# Patient Record
Sex: Female | Born: 1990 | Race: Black or African American | Hispanic: No | Marital: Single | Smoking: Current every day smoker
Health system: Southern US, Community
[De-identification: ages and names within clinical notes are randomized; demographics above are authoritative.]

## PROBLEM LIST (undated history)

## (undated) ENCOUNTER — Inpatient Hospital Stay: Payer: Self-pay

## (undated) DIAGNOSIS — K819 Cholecystitis, unspecified: Secondary | ICD-10-CM

## (undated) DIAGNOSIS — F1910 Other psychoactive substance abuse, uncomplicated: Secondary | ICD-10-CM

## (undated) DIAGNOSIS — Z8719 Personal history of other diseases of the digestive system: Secondary | ICD-10-CM

## (undated) HISTORY — DX: Cholecystitis, unspecified: K81.9

---
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
## 2013-09-27 ENCOUNTER — Emergency Department: Payer: Self-pay | Admitting: Emergency Medicine

## 2013-09-27 LAB — URINALYSIS, COMPLETE
BILIRUBIN, UR: NEGATIVE
Blood: NEGATIVE
GLUCOSE, UR: NEGATIVE mg/dL (ref 0–75)
Ketone: NEGATIVE
NITRITE: NEGATIVE
PH: 7 (ref 4.5–8.0)
Protein: 30
Specific Gravity: 1.026 (ref 1.003–1.030)
Squamous Epithelial: 1

## 2014-03-03 ENCOUNTER — Emergency Department: Payer: Self-pay | Admitting: Emergency Medicine

## 2014-03-03 LAB — CBC
HCT: 40.5 % (ref 35.0–47.0)
HGB: 13 g/dL (ref 12.0–16.0)
MCH: 28.4 pg (ref 26.0–34.0)
MCHC: 32.2 g/dL (ref 32.0–36.0)
MCV: 88 fL (ref 80–100)
PLATELETS: 314 10*3/uL (ref 150–440)
RBC: 4.6 10*6/uL (ref 3.80–5.20)
RDW: 13.9 % (ref 11.5–14.5)
WBC: 8.9 10*3/uL (ref 3.6–11.0)

## 2014-03-03 LAB — BASIC METABOLIC PANEL
Anion Gap: 6 — ABNORMAL LOW (ref 7–16)
BUN: 7 mg/dL (ref 7–18)
Calcium, Total: 8.8 mg/dL (ref 8.5–10.1)
Chloride: 108 mmol/L — ABNORMAL HIGH (ref 98–107)
Co2: 27 mmol/L (ref 21–32)
Creatinine: 0.99 mg/dL (ref 0.60–1.30)
EGFR (African American): >60
GLUCOSE: 80 mg/dL (ref 65–99)
Osmolality: 278 (ref 275–301)
Potassium: 3.6 mmol/L (ref 3.5–5.1)
Sodium: 141 mmol/L (ref 136–145)

## 2014-03-03 LAB — URINALYSIS, COMPLETE
BILIRUBIN, UR: NEGATIVE
Bacteria: NONE SEEN
Blood: NEGATIVE
GLUCOSE, UR: NEGATIVE mg/dL (ref 0–75)
Nitrite: NEGATIVE
PROTEIN: NEGATIVE
Ph: 6 (ref 4.5–8.0)
RBC,UR: 3 /HPF (ref 0–5)
Specific Gravity: 1.027 (ref 1.003–1.030)
WBC UR: 8 /HPF (ref 0–5)

## 2014-05-27 ENCOUNTER — Emergency Department: Payer: Self-pay | Admitting: Emergency Medicine

## 2014-09-04 ENCOUNTER — Emergency Department: Payer: Self-pay | Admitting: Internal Medicine

## 2014-09-04 LAB — COMPREHENSIVE METABOLIC PANEL
ALBUMIN: 3.9 g/dL (ref 3.4–5.0)
ALK PHOS: 60 U/L
Anion Gap: 8 (ref 7–16)
BUN: 10 mg/dL (ref 7–18)
Bilirubin,Total: 0.4 mg/dL (ref 0.2–1.0)
CHLORIDE: 110 mmol/L — AB (ref 98–107)
Calcium, Total: 8.7 mg/dL (ref 8.5–10.1)
Co2: 24 mmol/L (ref 21–32)
Creatinine: 0.89 mg/dL (ref 0.60–1.30)
EGFR (African American): >60
EGFR (Non-African Amer.): >60
Glucose: 88 mg/dL (ref 65–99)
OSMOLALITY: 282 (ref 275–301)
POTASSIUM: 3.5 mmol/L (ref 3.5–5.1)
SGOT(AST): 25 U/L (ref 15–37)
SGPT (ALT): 20 U/L
Sodium: 142 mmol/L (ref 136–145)
TOTAL PROTEIN: 7.6 g/dL (ref 6.4–8.2)

## 2014-09-04 LAB — CBC WITH DIFFERENTIAL/PLATELET
Basophil #: 0 10*3/uL (ref 0.0–0.1)
Basophil %: 0.3 %
Eosinophil #: 0.1 10*3/uL (ref 0.0–0.7)
Eosinophil %: 0.8 %
HCT: 42.8 % (ref 35.0–47.0)
HGB: 13.4 g/dL (ref 12.0–16.0)
LYMPHS ABS: 2.1 10*3/uL (ref 1.0–3.6)
Lymphocyte %: 22.7 %
MCH: 28.5 pg (ref 26.0–34.0)
MCHC: 31.3 g/dL — ABNORMAL LOW (ref 32.0–36.0)
MCV: 91 fL (ref 80–100)
Monocyte #: 0.5 x10 3/mm (ref 0.2–0.9)
Monocyte %: 5.6 %
NEUTROS PCT: 70.6 %
Neutrophil #: 6.5 10*3/uL (ref 1.4–6.5)
PLATELETS: 299 10*3/uL (ref 150–440)
RBC: 4.7 10*6/uL (ref 3.80–5.20)
RDW: 13 % (ref 11.5–14.5)
WBC: 9.2 10*3/uL (ref 3.6–11.0)

## 2014-09-04 LAB — TROPONIN I

## 2014-09-09 DIAGNOSIS — Z8719 Personal history of other diseases of the digestive system: Secondary | ICD-10-CM

## 2014-09-09 HISTORY — DX: Personal history of other diseases of the digestive system: Z87.19

## 2014-10-14 ENCOUNTER — Emergency Department: Payer: Self-pay | Admitting: Emergency Medicine

## 2014-10-14 LAB — URINALYSIS, COMPLETE
BILIRUBIN, UR: NEGATIVE
Bacteria: NONE SEEN
Blood: NEGATIVE
GLUCOSE, UR: NEGATIVE mg/dL (ref 0–75)
KETONE: NEGATIVE
Leukocyte Esterase: NEGATIVE
Nitrite: NEGATIVE
Ph: 6 (ref 4.5–8.0)
Protein: NEGATIVE
SPECIFIC GRAVITY: 1.024 (ref 1.003–1.030)

## 2014-10-14 LAB — HCG, QUANTITATIVE, PREGNANCY: Beta Hcg, Quant.: 25704 m[IU]/mL — ABNORMAL HIGH

## 2014-10-18 ENCOUNTER — Ambulatory Visit: Payer: Self-pay | Admitting: Family Medicine

## 2015-05-18 ENCOUNTER — Emergency Department: Payer: Medicaid Other

## 2015-05-18 ENCOUNTER — Observation Stay
Admission: EM | Admit: 2015-05-18 | Discharge: 2015-05-20 | Disposition: A | Discharge status: Home / Self Care | Payer: Medicaid Other | Attending: Surgery | Admitting: Surgery

## 2015-05-18 ENCOUNTER — Encounter: Payer: Self-pay | Admitting: Emergency Medicine

## 2015-05-18 DIAGNOSIS — K8012 Calculus of gallbladder with acute and chronic cholecystitis without obstruction: Secondary | ICD-10-CM | POA: Diagnosis not present

## 2015-05-18 DIAGNOSIS — K810 Acute cholecystitis: Secondary | ICD-10-CM | POA: Insufficient documentation

## 2015-05-18 DIAGNOSIS — R112 Nausea with vomiting, unspecified: Secondary | ICD-10-CM | POA: Insufficient documentation

## 2015-05-18 DIAGNOSIS — K819 Cholecystitis, unspecified: Secondary | ICD-10-CM | POA: Diagnosis present

## 2015-05-18 LAB — CBC WITH DIFFERENTIAL/PLATELET
BASOS PCT: 0 %
Basophils Absolute: 0 10*3/uL (ref 0–0.1)
EOS ABS: 0.2 10*3/uL (ref 0–0.7)
EOS PCT: 2 %
HEMATOCRIT: 42.3 % (ref 35.0–47.0)
Hemoglobin: 14 g/dL (ref 12.0–16.0)
Lymphocytes Relative: 23 %
Lymphs Abs: 2.6 10*3/uL (ref 1.0–3.6)
MCH: 29 pg (ref 26.0–34.0)
MCHC: 33.1 g/dL (ref 32.0–36.0)
MCV: 87.4 fL (ref 80.0–100.0)
MONO ABS: 0.8 10*3/uL (ref 0.2–0.9)
MONOS PCT: 7 %
Neutro Abs: 7.6 10*3/uL — ABNORMAL HIGH (ref 1.4–6.5)
Neutrophils Relative %: 68 %
Platelets: 312 10*3/uL (ref 150–440)
RBC: 4.84 MIL/uL (ref 3.80–5.20)
RDW: 12.7 % (ref 11.5–14.5)
WBC: 11.2 10*3/uL — ABNORMAL HIGH (ref 3.6–11.0)

## 2015-05-18 LAB — COMPREHENSIVE METABOLIC PANEL
ALBUMIN: 4 g/dL (ref 3.5–5.0)
ALK PHOS: 66 U/L (ref 38–126)
ALT: 11 U/L — AB (ref 14–54)
AST: 21 U/L (ref 15–41)
Anion gap: 6 (ref 5–15)
BUN: 9 mg/dL (ref 6–20)
CALCIUM: 9.4 mg/dL (ref 8.9–10.3)
CO2: 28 mmol/L (ref 22–32)
CREATININE: 0.84 mg/dL (ref 0.44–1.00)
Chloride: 106 mmol/L (ref 101–111)
GFR calc Af Amer: >60 mL/min (ref >60)
GFR calc non Af Amer: >60 mL/min (ref >60)
GLUCOSE: 104 mg/dL — AB (ref 65–99)
Potassium: 3.5 mmol/L (ref 3.5–5.1)
SODIUM: 140 mmol/L (ref 135–145)
Total Bilirubin: 0.3 mg/dL (ref 0.3–1.2)
Total Protein: 7.2 g/dL (ref 6.5–8.1)

## 2015-05-18 LAB — LIPASE, BLOOD: LIPASE: 21 U/L — AB (ref 22–51)

## 2015-05-18 MED ORDER — ONDANSETRON HCL 4 MG/2ML IJ SOLN
4.0000 mg | Freq: Four times a day (QID) | INTRAMUSCULAR | Status: DC | PRN
  Administered 2015-05-19: 4 mg via INTRAVENOUS
  Filled 2015-05-18: qty 2

## 2015-05-18 MED ORDER — ONDANSETRON HCL 4 MG/2ML IJ SOLN
4.0000 mg | Freq: Once | INTRAMUSCULAR | Status: AC
  Administered 2015-05-18: 4 mg via INTRAVENOUS

## 2015-05-18 MED ORDER — OXYCODONE-ACETAMINOPHEN 5-325 MG PO TABS
1.0000 | ORAL_TABLET | Freq: Four times a day (QID) | ORAL | Status: DC | PRN
  Administered 2015-05-19 – 2015-05-20 (×2): 2 via ORAL
  Filled 2015-05-18 (×2): qty 2

## 2015-05-18 MED ORDER — FAMOTIDINE IN NACL 20-0.9 MG/50ML-% IV SOLN
20.0000 mg | Freq: Once | INTRAVENOUS | Status: AC
  Administered 2015-05-18: 20 mg via INTRAVENOUS
  Filled 2015-05-18: qty 50

## 2015-05-18 MED ORDER — SODIUM CHLORIDE 0.9 % IV SOLN: 8.0000 mg | Freq: Once | INTRAVENOUS | Status: DC

## 2015-05-18 MED ORDER — PROMETHAZINE HCL 25 MG PO TABS
12.5000 mg | ORAL_TABLET | Freq: Four times a day (QID) | ORAL | Status: DC | PRN
  Administered 2015-05-18: 12.5 mg via ORAL
  Filled 2015-05-18 (×2): qty 1

## 2015-05-18 MED ORDER — HEPARIN SODIUM (PORCINE) 5000 UNIT/ML IJ SOLN
5000.0000 [IU] | Freq: Three times a day (TID) | INTRAMUSCULAR | Status: DC
  Administered 2015-05-18 – 2015-05-20 (×6): 5000 [IU] via SUBCUTANEOUS
  Filled 2015-05-18 (×6): qty 1

## 2015-05-18 MED ORDER — ACETAMINOPHEN 500 MG PO TABS
1000.0000 mg | ORAL_TABLET | Freq: Once | ORAL | Status: AC
  Administered 2015-05-18: 1000 mg via ORAL
  Filled 2015-05-18: qty 2

## 2015-05-18 MED ORDER — ONDANSETRON HCL 4 MG/2ML IJ SOLN
INTRAMUSCULAR | Status: AC
  Filled 2015-05-18: qty 4

## 2015-05-18 MED ORDER — ACETAMINOPHEN 325 MG PO TABS
650.0000 mg | ORAL_TABLET | Freq: Four times a day (QID) | ORAL | Status: DC | PRN
Start: 2015-05-18 — End: 2015-05-20

## 2015-05-18 MED ORDER — ACETAMINOPHEN 650 MG RE SUPP
650.0000 mg | Freq: Four times a day (QID) | RECTAL | Status: DC | PRN
Start: 2015-05-18 — End: 2015-05-20

## 2015-05-18 MED ORDER — HYDROMORPHONE HCL 1 MG/ML IJ SOLN
1.0000 mg | INTRAMUSCULAR | Status: DC | PRN
  Administered 2015-05-18 – 2015-05-20 (×7): 1 mg via INTRAVENOUS
  Filled 2015-05-18 (×8): qty 1

## 2015-05-18 MED ORDER — ONDANSETRON HCL 4 MG/2ML IJ SOLN
4.0000 mg | Freq: Once | INTRAMUSCULAR | Status: AC
  Administered 2015-05-18: 4 mg via INTRAVENOUS
  Filled 2015-05-18: qty 2

## 2015-05-18 MED ORDER — SODIUM CHLORIDE 0.9 % IV SOLN
3.0000 g | Freq: Four times a day (QID) | INTRAVENOUS | Status: DC
  Administered 2015-05-18 – 2015-05-19 (×5): 3 g via INTRAVENOUS
  Filled 2015-05-18 (×8): qty 3

## 2015-05-18 MED ORDER — DEXTROSE IN LACTATED RINGERS 5 % IV SOLN
INTRAVENOUS | Status: DC
  Administered 2015-05-18 – 2015-05-20 (×4): via INTRAVENOUS

## 2015-05-18 MED ORDER — SODIUM CHLORIDE 0.9 % IV BOLUS (SEPSIS)
1000.0000 mL | Freq: Once | INTRAVENOUS | Status: AC
  Administered 2015-05-18: 1000 mL via INTRAVENOUS

## 2015-05-18 NOTE — ED Notes (Signed)
Pt to u/s and xray at this time

## 2015-05-18 NOTE — H&P (Signed)
Tracy Glover is an 24 y.o. female.     Chief Complaint:     One (1) week of not feeling well, abdominal pain   HPI:    24 year old female without past medical history presents to the emergency room with approximately one-week history of nearly persistent right upper quadrant abdominal pain radiating to her back along with nausea vomiting and inability to take by mouth. Workup in the emergency room demonstrates gallstone lodged within the gallbladder neck. She's had 2 children ages 12 and 65 both by cesarean section without complication. The patient does not smoke and occasionally drinks. Does not have any current medications or allergies to medications.  Past medical history negative.  Past surgical history cesarean section 2.  Family history is negative for cholelithiasis.  Social History:  reports that she has never smoked. She has never used smokeless tobacco. She reports that she does not drink alcohol. Her drug history is not on file.  Allergies: No Known Allergies   Review of Systems  Constitutional: Positive for malaise/fatigue and diaphoresis. Negative for fever, chills and weight loss.  HENT: Negative.   Eyes: Negative.   Cardiovascular: Negative for chest pain and palpitations.  Gastrointestinal: Positive for nausea, vomiting and abdominal pain. Negative for heartburn, diarrhea, constipation, blood in stool and melena.  Genitourinary: Negative for dysuria, urgency and frequency.  Skin: Negative.   Neurological: Positive for weakness.    Physical Exam:  Physical Exam  Constitutional: She is oriented to person, place, and time and well-developed, well-nourished, and in no distress. No distress.  HENT:  Head: Normocephalic and atraumatic.  Eyes: Conjunctivae are normal. Pupils are equal, round, and reactive to light.  Neck: Normal range of motion.  Cardiovascular: Normal rate and regular rhythm.   Pulmonary/Chest: No respiratory distress.  Abdominal: Soft.  Normal appearance. She exhibits no mass. There is tenderness in the right lower quadrant and epigastric area. There is rebound, guarding and positive Murphy's sign. There is no CVA tenderness.    Neurological: She is oriented to person, place, and time.  Skin: Skin is warm and dry. She is not diaphoretic.  Psychiatric: Mood, memory, affect and judgment normal.    Blood pressure 117/75, pulse 68, temperature 98.5 F (36.9 C), temperature source Oral, resp. rate 18, height 5' 6"  (1.676 m), weight 170 lb (77.111 kg), last menstrual period 04/17/2015, SpO2 100 %.  Results for orders placed or performed during the hospital encounter of 05/18/15 (from the past 48 hour(s))  Comprehensive metabolic panel     Status: Abnormal   Collection Time: 05/18/15  4:00 AM  Result Value Ref Range   Sodium 140 135 - 145 mmol/L   Potassium 3.5 3.5 - 5.1 mmol/L   Chloride 106 101 - 111 mmol/L   CO2 28 22 - 32 mmol/L   Glucose, Bld 104 (H) 65 - 99 mg/dL   BUN 9 6 - 20 mg/dL   Creatinine, Ser 0.84 0.44 - 1.00 mg/dL   Calcium 9.4 8.9 - 10.3 mg/dL   Total Protein 7.2 6.5 - 8.1 g/dL   Albumin 4.0 3.5 - 5.0 g/dL   AST 21 15 - 41 U/L   ALT 11 (L) 14 - 54 U/L   Alkaline Phosphatase 66 38 - 126 U/L   Total Bilirubin 0.3 0.3 - 1.2 mg/dL   GFR calc non Af Amer >60 >60 mL/min   GFR calc Af Amer >60 >60 mL/min    Comment: (NOTE) The eGFR has been calculated using the  CKD EPI equation. This calculation has not been validated in all clinical situations. eGFR's persistently <60 mL/min signify possible Chronic Kidney Disease.    Anion gap 6 5 - 15  CBC WITH DIFFERENTIAL     Status: Abnormal   Collection Time: 05/18/15  4:00 AM  Result Value Ref Range   WBC 11.2 (H) 3.6 - 11.0 K/uL   RBC 4.84 3.80 - 5.20 MIL/uL   Hemoglobin 14.0 12.0 - 16.0 g/dL   HCT 42.3 35.0 - 47.0 %   MCV 87.4 80.0 - 100.0 fL   MCH 29.0 26.0 - 34.0 pg   MCHC 33.1 32.0 - 36.0 g/dL   RDW 12.7 11.5 - 14.5 %   Platelets 312 150 - 440 K/uL    Neutrophils Relative % 68 %   Neutro Abs 7.6 (H) 1.4 - 6.5 K/uL   Lymphocytes Relative 23 %   Lymphs Abs 2.6 1.0 - 3.6 K/uL   Monocytes Relative 7 %   Monocytes Absolute 0.8 0.2 - 0.9 K/uL   Eosinophils Relative 2 %   Eosinophils Absolute 0.2 0 - 0.7 K/uL   Basophils Relative 0 %   Basophils Absolute 0.0 0 - 0.1 K/uL  Lipase, blood     Status: Abnormal   Collection Time: 05/18/15  4:00 AM  Result Value Ref Range   Lipase 21 (L) 22 - 51 U/L   Dg Chest 2 View  05/18/2015   CLINICAL DATA:  Acute epigastric abdominal pain.  EXAM: CHEST  2 VIEW  COMPARISON:  September 04, 2014.  FINDINGS: The heart size and mediastinal contours are within normal limits. Both lungs are clear. No pneumothorax or pleural effusion is noted. The visualized skeletal structures are unremarkable.  IMPRESSION: No active cardiopulmonary disease.   Electronically Signed   By: Marijo Conception, M.D.   On: 05/18/2015 10:09   US Abdomen Limited Ruq  05/18/2015   CLINICAL DATA:  Right upper quadrant pain, vomiting for 1 week  EXAM: US ABDOMEN LIMITED - RIGHT UPPER QUADRANT  COMPARISON:  None.  FINDINGS: Gallbladder:  Nonmobile gallstone noted in gallbladder neck region measures 1.7 cm. There is thickening of gallbladder wall up to 4 mm. Early acute cholecystitis cannot be excluded. Clinical correlation is necessary. No sonographic Murphy's sign.  Common bile duct:  Diameter: 3.7 mm in diameter.  Liver:  No focal lesion identified. Within normal limits in parenchymal echogenicity.  IMPRESSION: Nonmobile gallstone noted in gallbladder neck region measures 1.7 cm. There is thickening of gallbladder wall up to 4 mm. Early acute cholecystitis cannot be excluded. Clinical correlation is necessary. No sonographic Murphy's sign.   Electronically Signed   By: Lahoma Crocker M.D.   On: 05/18/2015 10:04    I personally reviewed the ultrasound images on the PACS monitor.   Assessment/Plan 24 year old with acute calculus cholecystitis, likely  hydrops  Plan: admission, IV Unasyn, set up for laparoscopic cholecystectomy Friday.  I discussed with her the procedure including need for an open operation bile duct injury being risk of 1 and 200 as well as possibility of needing ERCP postoperatively. All of her questions were answered. She is in agreement with this plan.  Hortencia Conradi, MD, FACS

## 2015-05-18 NOTE — ED Provider Notes (Signed)
[facility] Emergency Department Provider Note  ____________________________________________  Time seen: 9:00 AM  I have reviewed the triage vital signs and the nursing notes.   HISTORY  Chief Complaint Abdominal Pain    HPI Tracy Glover is a 24 y.o. female who complains of right upper quadrant and right lower chest pain for the past week. It's been constant and gradually worsening over the past week, worse with eating. He also produces significant nausea and vomiting and she's been unable to eat or drink much over the last week. Never had anything like this before. No sick contacts, no trauma. No fevers or chills  The lower chest pain is sharp. She notes that it is sore over the inferior ribs anteriorly particularly with palpation. No recent travel trauma hospitalizations or surgeries. No history of DVT or PE. No leg pain or swelling.    No past medical history on file. Negative  There are no active problems to display for this patient.    No past surgical history on file. C-section  No current outpatient prescriptions on file. None  Allergies Review of patient's allergies indicates no known allergies.  None No family history on file.  Social History Social History  Substance Use Topics  . Smoking status: Never Smoker   . Smokeless tobacco: Never Used  . Alcohol Use: No    Review of Systems  Constitutional:   No fever or chills. No weight changes Eyes:   No blurry vision or double vision.  ENT:   No sore throat. Cardiovascular:   Anterior chest wall pain as above Respiratory:   No dyspnea or cough. Gastrointestinal:   Right upper quadrant abdominal pain with vomiting as above no diarrhea.  No BRBPR or melena. Genitourinary:   Negative for dysuria, urinary retention, bloody urine, or difficulty urinating. Musculoskeletal:   Negative for back pain. No joint swelling or pain. Skin:   Negative for rash. Neurological:   Negative  for headaches, focal weakness or numbness. Psychiatric:  No anxiety or depression.   Endocrine:  No hot/cold intolerance, changes in energy, or sleep difficulty.  10-point ROS otherwise negative.  ____________________________________________   PHYSICAL EXAM:  VITAL SIGNS: ED Triage Vitals  Enc Vitals Group     BP 05/18/15 0347 106/84 mmHg     Pulse Rate 05/18/15 0347 84     Resp 05/18/15 0347 20     Temp 05/18/15 0347 98 F (36.7 C)     Temp Source 05/18/15 0347 Oral     SpO2 05/18/15 0347 96 %     Weight 05/18/15 0347 170 lb (77.111 kg)     Height 05/18/15 0347 5\' 6"  (1.676 m)     Head Cir --      Peak Flow --      Pain Score 05/18/15 0350 9     Pain Loc --      Pain Edu? --      Excl. in GC? --      Constitutional:   Alert and oriented. Well appearing and in no distress. Eyes:   No scleral icterus. No conjunctival pallor. PERRL. EOMI ENT   Head:   Normocephalic and atraumatic.   Nose:   No congestion/rhinnorhea. No septal hematoma   Mouth/Throat:   MMM, no pharyngeal erythema. No peritonsillar mass. No uvula shift.   Neck:   No stridor. No SubQ emphysema. No meningismus. Hematological/Lymphatic/Immunilogical:   No cervical lymphadenopathy. Cardiovascular:   RRR. Normal and symmetric distal pulses are present in  all extremities. No murmurs, rubs, or gallops. Respiratory:   Normal respiratory effort without tachypnea nor retractions. Breath sounds are clear and equal bilaterally. No wheezes/rales/rhonchi. Right anterior chest wall is tender to palpation over the false ribs inferiorly. Gastrointestinal:  Soft with right upper quadrant and epigastric tenderness. No distention. There is no CVA tenderness.  No rebound, rigidity, or guarding. Genitourinary:   deferred Musculoskeletal:   Nontender with normal range of motion in all extremities. No joint effusions.  No lower extremity tenderness.  No edema. Neurologic:   Normal speech and language.  CN 2-10  normal. Motor grossly intact. No pronator drift.  Normal gait. No gross focal neurologic deficits are appreciated.  Skin:    Skin is warm, dry and intact. No rash noted.  No petechiae, purpura, or bullae. Psychiatric:   Mood and affect are normal. Speech and behavior are normal. Patient exhibits appropriate insight and judgment.  ____________________________________________    LABS (pertinent positives/negatives) (all labs ordered are listed, but only abnormal results are displayed) Labs Reviewed  COMPREHENSIVE METABOLIC PANEL - Abnormal; Notable for the following:    Glucose, Bld 104 (*)    ALT 11 (*)    All other components within normal limits  CBC WITH DIFFERENTIAL/PLATELET - Abnormal; Notable for the following:    WBC 11.2 (*)    Neutro Abs 7.6 (*)    All other components within normal limits  LIPASE, BLOOD - Abnormal; Notable for the following:    Lipase 21 (*)    All other components within normal limits   ____________________________________________   EKG    ____________________________________________    RADIOLOGY  Chest x-ray unremarkable Ultrasound right upper quadrant reveals 1.7 cm nonmobile gallstone fixed in the gallbladder neck with changes suggestive of early cholecystitis  ____________________________________________   PROCEDURES   ____________________________________________   INITIAL IMPRESSION / ASSESSMENT AND PLAN / ED COURSE  Pertinent labs & imaging results that were available during my care of the patient were reviewed by me and considered in my medical decision making (see chart for details).  Evaluation concerning for cholecystitis. Patient is afebrile with normal vital signs at this time. She is feeling better after antiemetics, we'll give her Tylenol for analgesia per her request, IV fluids for dehydration, and check chest x-ray and ultrasound. Labs are unremarkable.  ----------------------------------------- 10:46 AM on  05/18/2015 -----------------------------------------  Ultrasound concerning for cholecystitis. Patient still having pain and tenderness. Discussed with surgery who will evaluate in the ED. Patient has not eaten anything since 7:00 yesterday. We'll keep her nothing by mouth for now. IV fluids.   ____________________________________________   FINAL CLINICAL IMPRESSION(S) / ED DIAGNOSES  Final diagnoses:  Cholecystitis      Sharman Cheek, MD 05/18/15 1047

## 2015-05-18 NOTE — ED Notes (Addendum)
Pt arrived by EMS from home with c/o Epigastric pain that radiates under her right breast and vomiting for the past week. States she cant keep anything down. Pt alert and tearful at this time. Normal bowel movement yesterday. Pt reports her pain improved slightly when she puts pressure on affected area. Actively vomiting during triage.

## 2015-05-18 NOTE — ED Notes (Signed)
Pt actively vomiting at this time. Dr. Manson Passey made aware. Orders received.

## 2015-05-18 NOTE — ED Notes (Signed)
Dr Colette Ribas called and stated that he was going to put in admission orders and that he would see the pt upstairs. Pt was informed that the provider would see her upstairs. Pt stated understanding.

## 2015-05-18 NOTE — ED Notes (Signed)
Pt returned from u/s and x ray, placed back on monitor.  Pt alert and oriented see MAR for meds administration

## 2015-05-19 ENCOUNTER — Encounter: Payer: Self-pay | Admitting: Anesthesiology

## 2015-05-19 ENCOUNTER — Observation Stay: Payer: Medicaid Other | Admitting: Anesthesiology

## 2015-05-19 ENCOUNTER — Encounter
Admission: EM | Disposition: A | Discharge status: Home / Self Care | Payer: Self-pay | Source: Home / Self Care | Attending: Emergency Medicine

## 2015-05-19 DIAGNOSIS — K8012 Calculus of gallbladder with acute and chronic cholecystitis without obstruction: Secondary | ICD-10-CM | POA: Diagnosis not present

## 2015-05-19 HISTORY — PX: CHOLECYSTECTOMY: SHX55

## 2015-05-19 LAB — MRSA PCR SCREENING: MRSA by PCR: NEGATIVE

## 2015-05-19 LAB — PREGNANCY, URINE: Preg Test, Ur: NEGATIVE

## 2015-05-19 SURGERY — LAPAROSCOPIC CHOLECYSTECTOMY
Anesthesia: General | Site: Abdomen | Wound class: Clean Contaminated

## 2015-05-19 MED ORDER — MIDAZOLAM HCL 2 MG/2ML IJ SOLN
INTRAMUSCULAR | Status: DC | PRN
  Administered 2015-05-19: 2 mg via INTRAVENOUS

## 2015-05-19 MED ORDER — FENTANYL CITRATE (PF) 100 MCG/2ML IJ SOLN
INTRAMUSCULAR | Status: AC
  Administered 2015-05-19: 25 ug via INTRAVENOUS
  Filled 2015-05-19: qty 2

## 2015-05-19 MED ORDER — PROPOFOL 10 MG/ML IV BOLUS
INTRAVENOUS | Status: DC | PRN
  Administered 2015-05-19: 150 mg via INTRAVENOUS

## 2015-05-19 MED ORDER — THROMBIN 5000 UNITS EX SOLR
CUTANEOUS | Status: DC | PRN
  Administered 2015-05-19: 2500 [IU] via TOPICAL

## 2015-05-19 MED ORDER — FENTANYL CITRATE (PF) 100 MCG/2ML IJ SOLN: 25.0000 ug | INTRAMUSCULAR | Status: DC | PRN

## 2015-05-19 MED ORDER — ONDANSETRON HCL 4 MG/2ML IJ SOLN: 4.0000 mg | Freq: Once | INTRAMUSCULAR | Status: DC | PRN

## 2015-05-19 MED ORDER — FENTANYL CITRATE (PF) 100 MCG/2ML IJ SOLN
INTRAMUSCULAR | Status: DC | PRN
  Administered 2015-05-19 (×4): 50 ug via INTRAVENOUS

## 2015-05-19 MED ORDER — LIDOCAINE HCL (CARDIAC) 20 MG/ML IV SOLN
INTRAVENOUS | Status: DC | PRN
  Administered 2015-05-19: 30 mg via INTRAVENOUS

## 2015-05-19 MED ORDER — THROMBIN 5000 UNITS EX SOLR
CUTANEOUS | Status: AC
  Filled 2015-05-19: qty 5000

## 2015-05-19 MED ORDER — KETOROLAC TROMETHAMINE 30 MG/ML IJ SOLN
30.0000 mg | Freq: Three times a day (TID) | INTRAMUSCULAR | Status: DC | PRN
  Administered 2015-05-19: 30 mg via INTRAVENOUS
  Filled 2015-05-19: qty 1

## 2015-05-19 MED ORDER — ROCURONIUM BROMIDE 100 MG/10ML IV SOLN
INTRAVENOUS | Status: DC | PRN
  Administered 2015-05-19: 50 mg via INTRAVENOUS

## 2015-05-19 MED ORDER — FENTANYL CITRATE (PF) 100 MCG/2ML IJ SOLN
25.0000 ug | INTRAMUSCULAR | Status: DC | PRN
  Administered 2015-05-19 (×4): 25 ug via INTRAVENOUS

## 2015-05-19 MED ORDER — LACTATED RINGERS IV SOLN
INTRAVENOUS | Status: DC | PRN
  Administered 2015-05-19: 11:00:00 via INTRAVENOUS

## 2015-05-19 MED ORDER — DEXAMETHASONE SODIUM PHOSPHATE 4 MG/ML IJ SOLN
INTRAMUSCULAR | Status: DC | PRN
  Administered 2015-05-19: 5 mg via INTRAVENOUS

## 2015-05-19 MED ORDER — NEOSTIGMINE METHYLSULFATE 10 MG/10ML IV SOLN
INTRAVENOUS | Status: DC | PRN
  Administered 2015-05-19: 3 mg via INTRAVENOUS

## 2015-05-19 MED ORDER — BUPIVACAINE HCL 0.25 % IJ SOLN
INTRAMUSCULAR | Status: DC | PRN
  Administered 2015-05-19: 20 mL

## 2015-05-19 MED ORDER — BUPIVACAINE HCL (PF) 0.25 % IJ SOLN
INTRAMUSCULAR | Status: AC
Start: 2015-05-19 — End: 2015-05-19
  Filled 2015-05-19: qty 30

## 2015-05-19 MED ORDER — GLYCOPYRROLATE 0.2 MG/ML IJ SOLN
INTRAMUSCULAR | Status: DC | PRN
  Administered 2015-05-19: 0.6 mg via INTRAVENOUS

## 2015-05-19 MED ORDER — ONDANSETRON HCL 4 MG/2ML IJ SOLN
INTRAMUSCULAR | Status: DC | PRN
  Administered 2015-05-19: 4 mg via INTRAVENOUS

## 2015-05-19 SURGICAL SUPPLY — 48 items
APPLICATOR SURGIFLO ENDO (HEMOSTASIS) ×3 IMPLANT
APPLIER CLIP 5 13 M/L LIGAMAX5 (MISCELLANEOUS) ×3
BAG COUNTER SPONGE EZ (MISCELLANEOUS) ×2 IMPLANT
BULB RESERV EVAC DRAIN JP 100C (MISCELLANEOUS) IMPLANT
CANISTER SUCT 1200ML W/VALVE (MISCELLANEOUS) ×3 IMPLANT
CATH CHOLANG 76X19 KUMAR (CATHETERS) ×3 IMPLANT
CHLORAPREP W/TINT 26ML (MISCELLANEOUS) ×3 IMPLANT
CLIP APPLIE 5 13 M/L LIGAMAX5 (MISCELLANEOUS) ×1 IMPLANT
CLOSURE WOUND 1/2 X4 (GAUZE/BANDAGES/DRESSINGS) ×1
COUNTER SPONGE BAG EZ (MISCELLANEOUS) ×1
DEFOGGER SCOPE WARMER CLEARIFY (MISCELLANEOUS) ×3 IMPLANT
DISSECTOR KITTNER STICK (MISCELLANEOUS) ×2 IMPLANT
DISSECTORS/KITTNER STICK (MISCELLANEOUS) ×6
DRAIN CHANNEL JP 19F (MISCELLANEOUS) IMPLANT
DRAPE C-ARM XRAY 36X54 (DRAPES) ×3 IMPLANT
DRAPE SHEET LG 3/4 BI-LAMINATE (DRAPES) ×3 IMPLANT
DRAPE UTILITY 15X26 TOWEL STRL (DRAPES) ×6 IMPLANT
DRSG TEGADERM 2-3/8X2-3/4 SM (GAUZE/BANDAGES/DRESSINGS) ×12 IMPLANT
DRSG TELFA 3X8 NADH (GAUZE/BANDAGES/DRESSINGS) ×3 IMPLANT
ENDOPOUCH RETRIEVER 10 (MISCELLANEOUS) ×3 IMPLANT
GLOVE BIO SURGEON STRL SZ7.5 (GLOVE) ×3 IMPLANT
GOWN STRL REUS W/ TWL LRG LVL3 (GOWN DISPOSABLE) ×2 IMPLANT
GOWN STRL REUS W/ TWL XL LVL3 (GOWN DISPOSABLE) ×1 IMPLANT
GOWN STRL REUS W/TWL LRG LVL3 (GOWN DISPOSABLE) ×4
GOWN STRL REUS W/TWL XL LVL3 (GOWN DISPOSABLE) ×2
IRRIGATION STRYKERFLOW (MISCELLANEOUS) ×1 IMPLANT
IRRIGATOR STRYKERFLOW (MISCELLANEOUS) ×3
IV NS 1000ML (IV SOLUTION) ×2
IV NS 1000ML BAXH (IV SOLUTION) ×1 IMPLANT
LABEL OR SOLS (LABEL) ×3 IMPLANT
NEEDLE HYPO 25X1 1.5 SAFETY (NEEDLE) ×3 IMPLANT
NS IRRIG 500ML POUR BTL (IV SOLUTION) ×3 IMPLANT
PACK LAP CHOLECYSTECTOMY (MISCELLANEOUS) ×3 IMPLANT
PAD GROUND ADULT SPLIT (MISCELLANEOUS) ×3 IMPLANT
SCISSORS METZENBAUM CVD 33 (INSTRUMENTS) ×3 IMPLANT
SLEEVE ADV FIXATION 5X100MM (TROCAR) ×6 IMPLANT
SPOGE SURGIFLO 8M (HEMOSTASIS) ×2
SPONGE SURGIFLO 8M (HEMOSTASIS) ×1 IMPLANT
STRAP SAFETY BODY (MISCELLANEOUS) ×3 IMPLANT
STRIP CLOSURE SKIN 1/2X4 (GAUZE/BANDAGES/DRESSINGS) ×2 IMPLANT
SUT ETHILON 3-0 FS-10 30 BLK (SUTURE) ×3
SUT VIC AB 0 UR5 27 (SUTURE) ×9 IMPLANT
SUT VIC AB 4-0 FS2 27 (SUTURE) ×3 IMPLANT
SUTURE EHLN 3-0 FS-10 30 BLK (SUTURE) ×1 IMPLANT
SWABSTK COMLB BENZOIN TINCTURE (MISCELLANEOUS) ×3 IMPLANT
TROCAR XCEL BLUNT TIP 100MML (ENDOMECHANICALS) ×3 IMPLANT
TROCAR Z-THREAD OPTICAL 5X100M (TROCAR) ×3 IMPLANT
TUBING INSUFFLATOR HI FLOW (MISCELLANEOUS) ×3 IMPLANT

## 2015-05-19 NOTE — Op Note (Signed)
05/18/2015 - 05/19/2015  12:26 PM  PATIENT:  Tracy Glover  24 y.o. female  PRE-OPERATIVE DIAGNOSIS:  Acute calculus cholecystitis  POST-OPERATIVE DIAGNOSIS:  Acute calculus cholecystitis with hydrops  PROCEDURE:  Procedure(s): LAPAROSCOPIC CHOLECYSTECTOMY (N/A)  SURGEON:  Surgeon(s) and Role:    * Natale Lay, MD - Primary  ASSISTANTS: none  ANESTHESIA:  Gen. endotracheal  SPECIMEN: Gallbladder with contents    EBL: Minimal  Findings hydrops. A Jackson-Pratt drain was left in place.  Description of procedure:    With informed consent supine position and general endotracheal anesthesia the patient's abdomen was widely prepped and draped utilizing chlor prep solution and a surgical timeout was observed. A 12 mm blunt Hassan trocar was placed via an open technique through an infraumbilical transversely oriented skin incision with stay sutures passed through the fascia. Pneumoperitoneum was established. The patient was in position in reverse Trendelenburg and airplane right side up. A 5 mm blade less trochars were placed in the right subcostal margin and epigastric region. The gallbladder was tensely distended and edematous. It was aspirated of approximately 20 cc of hydropic fluid. The gallbladder was placed on tension and omental adhesions were taken down off the body utilizing point cautery and some blunt dissection. Dissection of the hepatoduodenal ligament demonstrated a cystic duct and single cystic artery with a critical view of safety cholecystectomy been achieved. The cystic duct was triply clipped on the portal side singly clipped on the gallbladder side and thus divided. The cystic artery was likewise divided. The gallbladder was then retrieved up gallbladder fossa utilizing hook cautery apparatus placed into an Endo Catch device and retrieved through the infraumbilical port site. Pneumoperitoneum was then reestablished. Topical thrombin soaked Surgicel was placed in the  gallbladder fossa along with a 19 mm Blake drain exiting the lower most right upper quadrant port site. Drain site was secured with 4-0 nylon suture. Ports were removed under direct visualization. There was minor spillage of bile during the case which was aspirated. A total of 1 L of warm normal saline was used to irrigate the right upper quadrant prior to closure. A total of 30 cc of 0.25% plain Marcaine was infiltrated along all skin and fascial incisions prior to closure. 4-0 Vicryls suture was used to reapproximate skin edges in subcuticular fashion. The patient was then extubated sterile dressings were placed and she was taken to the recovery room in stable and satisfactory condition.

## 2015-05-19 NOTE — Anesthesia Preprocedure Evaluation (Signed)
Anesthesia Evaluation  Patient identified by MRN, date of birth, ID band Patient awake    Reviewed: Allergy & Precautions, NPO status , Patient's Chart, lab work & pertinent test results, reviewed documented beta blocker date and time   Airway Mallampati: II  TM Distance: >3 FB     Dental  (+) Chipped   Pulmonary           Cardiovascular      Neuro/Psych    GI/Hepatic   Endo/Other    Renal/GU      Musculoskeletal   Abdominal   Peds  Hematology   Anesthesia Other Findings   Reproductive/Obstetrics                             Anesthesia Physical Anesthesia Plan  ASA: II  Anesthesia Plan: General   Post-op Pain Management:    Induction: Intravenous  Airway Management Planned: Oral ETT  Additional Equipment:   Intra-op Plan:   Post-operative Plan:   Informed Consent: I have reviewed the patients History and Physical, chart, labs and discussed the procedure including the risks, benefits and alternatives for the proposed anesthesia with the patient or authorized representative who has indicated his/her understanding and acceptance.     Plan Discussed with: CRNA  Anesthesia Plan Comments:         Anesthesia Quick Evaluation  

## 2015-05-19 NOTE — Progress Notes (Signed)
Patient ID: Tracy Glover, female   DOB: 07/14/1991, 24 y.o.   MRN: [id number]   No real change  Exam unchanged  Wishes to proceed with laparoscopic cholecystectomy  She voiced no further questions  Significant other at bedside.

## 2015-05-19 NOTE — Anesthesia Procedure Notes (Signed)
Procedure Name: Intubation Date/Time: 05/19/2015 11:00 AM Performed by: Charna Busman Pre-anesthesia Checklist: Patient identified, Emergency Drugs available, Suction available and Patient being monitored Patient Re-evaluated:Patient Re-evaluated prior to inductionOxygen Delivery Method: Circle system utilized Preoxygenation: Pre-oxygenation with 100% oxygen Intubation Type: IV induction and Combination inhalational/ intravenous induction Ventilation: Mask ventilation without difficulty Laryngoscope Size: Miller and 3 Grade View: Grade II Tube type: Oral Tube size: 7.0 mm Number of attempts: 1 Airway Equipment and Method: Stylet Placement Confirmation: ETT inserted through vocal cords under direct vision,  positive ETCO2,  CO2 detector and breath sounds checked- equal and bilateral Secured at: 21 cm Tube secured with: Tape Dental Injury: Teeth and Oropharynx as per pre-operative assessment

## 2015-05-19 NOTE — Transfer of Care (Signed)
Immediate Anesthesia Transfer of Care Note  Patient: Sharen Hint  Procedure(s) Performed: Procedure(s): LAPAROSCOPIC CHOLECYSTECTOMY (N/A)  Patient Location: PACU  Anesthesia Type:General  Level of Consciousness: awake, oriented and patient cooperative  Airway & Oxygen Therapy: Patient Spontanous Breathing and Patient connected to face mask oxygen  Post-op Assessment: Report given to RN and Post -op Vital signs reviewed and stable  Post vital signs: Reviewed and stable  Last Vitals:  Filed Vitals:   05/19/15 1243  BP:   Pulse: 65  Temp: 36.5 C  Resp: 22    Complications: No apparent anesthesia complications

## 2015-05-20 MED ORDER — PROMETHAZINE HCL 12.5 MG PO TABS
12.5000 mg | ORAL_TABLET | Freq: Four times a day (QID) | ORAL | Status: DC | PRN
Start: 2015-05-20 — End: 2016-01-08

## 2015-05-20 MED ORDER — HYDROMORPHONE HCL 1 MG/ML IJ SOLN
1.0000 mg | Freq: Once | INTRAMUSCULAR | Status: AC
  Administered 2015-05-20: 1 mg via INTRAVENOUS

## 2015-05-20 MED ORDER — OXYCODONE-ACETAMINOPHEN 5-325 MG PO TABS: 1.0000 | ORAL_TABLET | Freq: Four times a day (QID) | ORAL | Status: DC | PRN

## 2015-05-20 MED ORDER — HYDROMORPHONE HCL 1 MG/ML IJ SOLN: 1.0000 mg | Freq: Once | INTRAMUSCULAR | Status: DC

## 2015-05-20 NOTE — Progress Notes (Signed)
Patient ID: Tracy Glover, female   DOB: 1991-03-31, 24 y.o.   MRN: [id number]   Surgery.  Postoperative day #1 status post laparoscopic cholecystectomy.  The patient is had moderate pain over the course of the evening. She does not have really anything to this morning. Jackson-Pratt drain remained nonbilious. She is complaining of significant pain around the exit site.  Filed Vitals:   05/19/15 1557 05/19/15 1618 05/19/15 2351 05/20/15 0755  BP: 119/78 112/65 101/75 114/72  Pulse: 55 70 60 50  Temp: 97.4 F (36.3 C) 97.9 F (36.6 C) 98.3 F (36.8 C) 98.3 F (36.8 C)  TempSrc: Oral Oral Oral Oral  Resp: Height:      Weight:      SpO2: 97% 95% 98% 100%    Physical examination Jackson-Pratt drain is nonbilious. Scan effluent. Abdomen is soft. There is some tenderness over the drain site.  Impression doing well.  Plan we will give her 2 mg of Dilantin intravenously and then removed the drain. We'll advance her diet this morning and transition to oral pain medications and anticipate discharge later this afternoon. She was in agreement.

## 2015-05-22 LAB — SURGICAL PATHOLOGY

## 2015-05-22 NOTE — Anesthesia Postprocedure Evaluation (Signed)
  Anesthesia Post-op Note  Patient: Tracy Glover  Procedure(s) Performed: Procedure(s): LAPAROSCOPIC CHOLECYSTECTOMY (N/A)  Anesthesia type:General  Patient location: PACU  Post pain: Pain level controlled  Post assessment: Post-op Vital signs reviewed, Patient's Cardiovascular Status Stable, Respiratory Function Stable, Patent Airway and No signs of Nausea or vomiting  Post vital signs: Reviewed and stable  Last Vitals:  Filed Vitals:   05/20/15 0755  BP: 114/72  Pulse: 50  Temp: 36.8 C  Resp: 16    Level of consciousness: awake, alert  and patient cooperative  Complications: No apparent anesthesia complications

## 2015-05-24 ENCOUNTER — Telehealth: Payer: Self-pay | Admitting: Surgery

## 2015-05-24 NOTE — Telephone Encounter (Signed)
Patients mother called and needs a refill on the pain medication and promethazine Had gallbladder Surg last Friday with Dr.Bird

## 2015-05-24 NOTE — Telephone Encounter (Signed)
Called Mother back and asked to speak with patient. Mother is currently at work and asked me to call daughter at [phone].   Call was made to [place] at this time to get her triaged. No answer. Left voicemail for return phone call.

## 2015-05-24 NOTE — Telephone Encounter (Signed)
Called patient once again at this time. Pt states that she has been taking 2 Percocet tablets every 6 hours around the clock and continues to having 8/10 pain while Percocet is in effect. Denies nausea, vomiting, and fever. Has not had a bowel movement since surgery and has not been using recommended stool softener. Informed that if patient develops vomiting or fever over night, she needs to immediately be seen in Emergency Room.  Advised patient to pick up Magnesium Citrate at Pharmacy and drink to get bowels moving as this may be major source of pain.  Spoke with Dr. Michela Pitcher at this time. He would like to see patient in office tomorrow. Appointment made for 3pm in [place] office- Dr. Michela Pitcher made aware.  Called patient back and she wanted me to speak with mother, Marcelino Duster. All instructions given to mother over the phone. She verified appointment details for patient as well as instructions for bowel movement.

## 2015-05-25 ENCOUNTER — Other Ambulatory Visit: Payer: Self-pay | Admitting: Surgery

## 2015-05-25 ENCOUNTER — Encounter: Payer: Self-pay | Admitting: Surgery

## 2015-05-25 ENCOUNTER — Ambulatory Visit (INDEPENDENT_AMBULATORY_CARE_PROVIDER_SITE_OTHER): Payer: Medicaid Other | Admitting: Surgery

## 2015-05-25 ENCOUNTER — Other Ambulatory Visit
Admission: RE | Admit: 2015-05-25 | Discharge: 2015-05-25 | Disposition: A | Discharge status: Home / Self Care | Payer: Medicaid Other | Source: Ambulatory Visit | Attending: Surgery | Admitting: Surgery

## 2015-05-25 VITALS — BP 127/80 | HR 116 | Temp 97.8°F | Ht 66.0 in | Wt 168.3 lb

## 2015-05-25 DIAGNOSIS — Z9049 Acquired absence of other specified parts of digestive tract: Secondary | ICD-10-CM

## 2015-05-25 DIAGNOSIS — R1011 Right upper quadrant pain: Secondary | ICD-10-CM

## 2015-05-25 LAB — CBC WITH DIFFERENTIAL/PLATELET
BASOS ABS: 0 10*3/uL (ref 0–0.1)
Basophils Relative: 1 %
Eosinophils Absolute: 0.6 10*3/uL (ref 0–0.7)
Eosinophils Relative: 7 %
HEMATOCRIT: 42.2 % (ref 35.0–47.0)
Hemoglobin: 13.6 g/dL (ref 12.0–16.0)
LYMPHS ABS: 1.6 10*3/uL (ref 1.0–3.6)
LYMPHS PCT: 18 %
MCH: 28.3 pg (ref 26.0–34.0)
MCHC: 32.4 g/dL (ref 32.0–36.0)
MCV: 87.4 fL (ref 80.0–100.0)
MONO ABS: 0.6 10*3/uL (ref 0.2–0.9)
Monocytes Relative: 6 %
NEUTROS ABS: 6.1 10*3/uL (ref 1.4–6.5)
Neutrophils Relative %: 68 %
Platelets: 387 10*3/uL (ref 150–440)
RBC: 4.83 MIL/uL (ref 3.80–5.20)
RDW: 13 % (ref 11.5–14.5)
WBC: 8.9 10*3/uL (ref 3.6–11.0)

## 2015-05-25 LAB — COMPREHENSIVE METABOLIC PANEL
ALT: 29 U/L (ref 14–54)
AST: 36 U/L (ref 15–41)
Albumin: 3.8 g/dL (ref 3.5–5.0)
Alkaline Phosphatase: 72 U/L (ref 38–126)
Anion gap: 6 (ref 5–15)
BILIRUBIN TOTAL: 0.5 mg/dL (ref 0.3–1.2)
BUN: 12 mg/dL (ref 6–20)
CO2: 28 mmol/L (ref 22–32)
CREATININE: 0.9 mg/dL (ref 0.44–1.00)
Calcium: 9.5 mg/dL (ref 8.9–10.3)
Chloride: 103 mmol/L (ref 101–111)
Glucose, Bld: 100 mg/dL — ABNORMAL HIGH (ref 65–99)
Potassium: 3.9 mmol/L (ref 3.5–5.1)
Sodium: 137 mmol/L (ref 135–145)
TOTAL PROTEIN: 8.3 g/dL — AB (ref 6.5–8.1)

## 2015-05-25 NOTE — Patient Instructions (Signed)
Go to the lab in the Medical Mall at [facility] at this time to have your blood work completed.   You also have an Ultrasound of your Gallbladder scheduled tomorrow morning. You need to arrive at the Medical Mall at 0900am. No food after Midnight tonight in preparation for this test.   We will call you with results as soon as they become available.  We have given you a prescription for a new pain medication today to see if this will help to take care of your pain.  You will still need to follow-up with Dr. Juliann Pulse as scheduled.

## 2015-05-25 NOTE — Progress Notes (Signed)
Outpatient Surgical Follow Up  05/25/2015  Tracy Glover is an 24 y.o. female.   Chief Complaint  Patient presents with  . Routine Post Op    Laparoscopic Cholecystectomy (9/9)- Dr. Egbert Garibaldi    HPI: She returns unexpectedly complaining of persistent abdominal pain constipation failure to improve with any pain medication. She's not been nauseated but not been able to very much. She's not had a bowel movement since surgery. She's been voiding spontaneously. Her pains primarily right lower quadrant as opposed right upper quadrant presurgery. She denies any fever or chills.  Past Medical History  Diagnosis Date  . Cholecystitis     Past Surgical History  Procedure Laterality Date  . Cholecystectomy N/A 05/19/2015    Procedure: LAPAROSCOPIC CHOLECYSTECTOMY;  Surgeon: Natale Lay, MD;  Location: [facility] ORS;  Service: General;  Laterality: N/A;    Family History  Problem Relation Age of Onset  . Cancer Mother   . Kidney disease Father     Currently on Dialysis    Social History:  reports that she has never smoked. She has never used smokeless tobacco. She reports that she does not drink alcohol or use illicit drugs.  Allergies: No Known Allergies  Medications reviewed.    ROS no new symptoms noted    BP 127/80 mmHg  Pulse 116  Temp(Src) 97.8 F (36.6 C) (Oral)  Ht  (1.676 m)  Wt 76.34 kg (168 lb 4.8 oz)  BMI 27.18 kg/m2  LMP 05/16/2015  Physical Exam she does not allow a physical examination complaining of too much abdominal tenderness. She does have active normal bowel sounds. Her wounds look good with no abnormalities.     No results found for this or any previous visit (from the past 48 hour(s)). No results found.  Assessment/Plan:  1. S/P cholecystectomy It is very difficult to assess her as she refuses a clinical examination saying that her abdomen is too tender to be touched. She does not have any fever but she is mildly tachycardic. There is possibility  of a bile leak or bile peritonitis that she did have small bile spill at surgery. We'll go on and get lab work to rule out any significant inflammatory or liver change and also get an abdominal ultrasound to rule out any fluid collections. This plans been discussed with her in detail. We will change her pain medication [place] of any benefit and encouraged her to take cathartics to help with her bowel function. She is in agreement.     Tiney Rouge III  05/25/2015,negative

## 2015-05-26 ENCOUNTER — Ambulatory Visit: Admission: RE | Admit: 2015-05-26 | Payer: Medicaid Other | Source: Ambulatory Visit

## 2015-05-26 ENCOUNTER — Telehealth: Payer: Self-pay

## 2015-05-26 NOTE — Telephone Encounter (Signed)
Was notified at this time that patient did not show up for Ultrasound this morning.  Patient was called at this time. No answer. Left voicemail for return phone call.  Call made to patient's mother. She states that patient ate this morning and was unable to go to Ultrasound. I gave patient's mother the phone number to Central Scheduling to reschedule.

## 2015-05-26 NOTE — Addendum Note (Signed)
Addendum  created 05/26/15 1420 by Berdine Addison, MD   Modules edited: Anesthesia Attestations

## 2015-05-29 NOTE — Discharge Summary (Signed)
Physician Discharge Summary  Patient ID: Tracy Glover MRN: [id number] DOB/AGE: Jul 27, 1991 24 y.o.  Admit date: 05/18/2015 Discharge date: 05/20/2015  Admission Diagnoses: Acute cholecystitis Cholelithiasis Discharge Diagnoses:  Active Problems:   Acute cholecystitis  cholelithiasis  Discharged Condition: Stable  Hospital Course: The patient was brought to the operating room for laparoscopic cholecystectomy was performed on September 9. A Jackson-Pratt drain was left in place. She had acute early cholecystitis. She had an impacted stone. There was evidence of hydrops.  The Jackson-Pratt drain did not have any bile in it on postoperative day #1. This was removed at the bedside. The patient tolerated this well. Her diet was advanced her pain seemed to be well-controlled on oral pain medications and she was discharged home.  Discharge Exam: Blood pressure 114/72, pulse 50, temperature 98.3 F (36.8 C), temperature source Oral, resp. rate 16, height  (1.676 m), weight 168 lb 1.6 oz (76.25 kg), last menstrual period 04/17/2015, SpO2 100 %. Abdomen was soft and nontender. Dressings were intact. Drain was removed at the time of her discharge.  Disposition: 01-Home or Self Care  Discharge Instructions    Call MD for:  persistant nausea and vomiting    Complete by:  As directed      Call MD for:  redness, tenderness, or signs of infection (pain, swelling, redness, odor or green/yellow discharge around incision site)    Complete by:  As directed      Call MD for:  severe uncontrolled pain    Complete by:  As directed      Call MD for:  temperature >100.4    Complete by:  As directed      Diet general    Complete by:  As directed      Discharge instructions    Complete by:  As directed   DISCHARGE INSTRUCTIONS TO PATIENT  REMINDER:  Carry a list of your medications and allergies with you at all times Call your pharmacy at least 1 week in advance to refill prescriptions Do not  mix any prescribed pain medicine with alcohol Do not drive any motor vehicles while taking pain medication. Take medications with food.  Do not retake a pain medication if you vomit after taking it.  Activity: no lifting more than 15 pounds until instructed by your doctor.   Dressing Care Instruction (if applicable):              Remove operative dressings in 48 hours.  May Shower-  Call office if any questions regarding this activity.  Dry Dressing as needed to operative site.  Drain care instructions provided to you in the hospital.   Follow-up appointments (date to return to physician): Call for appointment with Dr. Natale Lay, MD at [phone] or [phone]  If need MD on call after hours and on weekends call Hospital operator at [phone] as ask to speak to Surgeon on call for [facility].  Call Surgeon if you have: Temperature greater than 100.4 Persistent nausea and vomiting Severe uncontrolled pain Redness, tenderness, or signs of infection (pain, swelling, redness, odor or green/yellow discharge around the site) Difficulty breathing, headache or visual disturbances Hives Persistent dizziness or light-headedness Extreme fatigue Any other questions or concerns you may have after discharge  In an emergency, call 911 or go to an Emergency Department at a nearby hospital  Diet:  Resume your usual diet.  Avoid spicy, greasy or heavy foods.  If you have nausea or vomiting, go back to liquids.  If you cannot keep liquids down, call your doctor.  Avoid alcohol consumption while on prescription pain medications. Good nutrition promotes healing. Increase fiber and fluids.     I understand and acknowledge receipt of the above instructions.                                                                                                                                        Patient or Guardian Signature                                                                     Date/Time                                                                                                                                        Physician's or R.N.'s Signature                                                                  Date/Time  The discharge instructions have been reviewed with the patient and/or Family Member/Parent/Guardian.  Patient and/or Family Member/Parent/Guardian signed and retained a printed copy.     Increase activity slowly    Complete by:  As directed      Remove dressing in 48 hours    Complete by:  As directed             Medication List    TAKE these medications        promethazine 12.5 MG tablet  Commonly known as:  PHENERGAN  Take 1 tablet (12.5 mg total) by mouth every 6 (six) hours as needed for nausea or vomiting.           Follow-up Information    Follow up with Natale Lay, MD In 1 week.   Specialties:  Surgery, Radiology   Why:  For  suture removal, For wound re-check   Contact information:   [address] [place] [address] [phone]       Signed: Natale Lay 05/29/2015, 8:03 PM

## 2015-05-30 ENCOUNTER — Ambulatory Visit: Payer: Medicaid Other | Admitting: Surgery

## 2015-05-31 ENCOUNTER — Ambulatory Visit: Payer: Medicaid Other | Admitting: Surgery

## 2015-09-26 ENCOUNTER — Other Ambulatory Visit: Payer: Self-pay | Admitting: Primary Care

## 2015-09-26 DIAGNOSIS — Z3201 Encounter for pregnancy test, result positive: Secondary | ICD-10-CM

## 2015-09-27 ENCOUNTER — Ambulatory Visit
Admission: RE | Admit: 2015-09-27 | Discharge: 2015-09-27 | Disposition: A | Discharge status: Home / Self Care | Payer: Medicaid Other | Source: Ambulatory Visit | Attending: Primary Care | Admitting: Primary Care

## 2015-09-27 DIAGNOSIS — Z3201 Encounter for pregnancy test, result positive: Secondary | ICD-10-CM | POA: Diagnosis not present

## 2015-09-27 DIAGNOSIS — Z3A01 Less than 8 weeks gestation of pregnancy: Secondary | ICD-10-CM | POA: Diagnosis not present

## 2015-09-28 DIAGNOSIS — R55 Syncope and collapse: Secondary | ICD-10-CM | POA: Insufficient documentation

## 2015-11-24 ENCOUNTER — Encounter: Payer: Self-pay | Admitting: Emergency Medicine

## 2015-11-24 ENCOUNTER — Emergency Department: Payer: Medicaid Other

## 2015-11-24 ENCOUNTER — Emergency Department
Admission: EM | Admit: 2015-11-24 | Discharge: 2015-11-24 | Disposition: A | Discharge status: Home / Self Care | Payer: Medicaid Other | Attending: Emergency Medicine | Admitting: Emergency Medicine

## 2015-11-24 DIAGNOSIS — Z3490 Encounter for supervision of normal pregnancy, unspecified, unspecified trimester: Secondary | ICD-10-CM

## 2015-11-24 DIAGNOSIS — B9689 Other specified bacterial agents as the cause of diseases classified elsewhere: Secondary | ICD-10-CM

## 2015-11-24 DIAGNOSIS — Z3A16 16 weeks gestation of pregnancy: Secondary | ICD-10-CM | POA: Insufficient documentation

## 2015-11-24 DIAGNOSIS — Z9049 Acquired absence of other specified parts of digestive tract: Secondary | ICD-10-CM | POA: Diagnosis not present

## 2015-11-24 DIAGNOSIS — R102 Pelvic and perineal pain: Secondary | ICD-10-CM

## 2015-11-24 DIAGNOSIS — K810 Acute cholecystitis: Secondary | ICD-10-CM | POA: Diagnosis not present

## 2015-11-24 DIAGNOSIS — R10814 Left lower quadrant abdominal tenderness: Secondary | ICD-10-CM | POA: Diagnosis not present

## 2015-11-24 DIAGNOSIS — O209 Hemorrhage in early pregnancy, unspecified: Secondary | ICD-10-CM | POA: Diagnosis present

## 2015-11-24 DIAGNOSIS — O23599 Infection of other part of genital tract in pregnancy, unspecified trimester: Secondary | ICD-10-CM | POA: Diagnosis not present

## 2015-11-24 DIAGNOSIS — N760 Acute vaginitis: Secondary | ICD-10-CM

## 2015-11-24 LAB — URINALYSIS COMPLETE WITH MICROSCOPIC (ARMC ONLY)
Bacteria, UA: NONE SEEN
Bilirubin Urine: NEGATIVE
Glucose, UA: NEGATIVE mg/dL
Hgb urine dipstick: NEGATIVE
Ketones, ur: NEGATIVE mg/dL
Nitrite: NEGATIVE
Protein, ur: NEGATIVE mg/dL
Specific Gravity, Urine: 1.02 (ref 1.005–1.030)
pH: 5 (ref 5.0–8.0)

## 2015-11-24 LAB — CHLAMYDIA/NGC RT PCR (ARMC ONLY)
Chlamydia Tr: NOT DETECTED
N GONORRHOEAE: NOT DETECTED

## 2015-11-24 LAB — WET PREP, GENITAL
Clue Cells Wet Prep HPF POC: POSITIVE — AB
Sperm: NONE SEEN
TRICH WET PREP: NONE SEEN
WBC, Wet Prep HPF POC: NONE SEEN
Yeast Wet Prep HPF POC: NONE SEEN

## 2015-11-24 LAB — HCG, QUANTITATIVE, PREGNANCY: hCG, Beta Chain, Quant, S: 42790 m[IU]/mL — ABNORMAL HIGH (ref <5)

## 2015-11-24 LAB — ABO/RH: ABO/RH(D): O POS

## 2015-11-24 MED ORDER — METRONIDAZOLE 500 MG PO TABS: 500.0000 mg | ORAL_TABLET | Freq: Two times a day (BID) | ORAL | Status: AC

## 2015-11-24 NOTE — ED Provider Notes (Signed)
[facility] Emergency Department Provider Note    ____________________________________________  Time seen: ~1455  I have reviewed the triage vital signs and the nursing notes.   HISTORY  Chief Complaint Vaginal Bleeding   History limited by: Not Limited   HPI Tracy Glover is a 25 y.o. female who states that she is pregnant although she is unaware of how far along she is, who presents to the emergency department today because of pelvic pain and vaginal discharge and spotting. She states the symptoms started 3 days ago. They have been consistent since then. She describes a left-sided pelvic pain. She also says grams vaginal spotting. She has also had additional white burning vaginal discharge. She thinks she has had fevers at nighttime. She denies any shortness of breath. She states she has not been taking prenatal vitamins.     Past Medical History  Diagnosis Date  . Cholecystitis     Patient Active Problem List   Diagnosis Date Noted  . Acute cholecystitis 05/18/2015    Past Surgical History  Procedure Laterality Date  . Cholecystectomy N/A 05/19/2015    Procedure: LAPAROSCOPIC CHOLECYSTECTOMY;  Surgeon: Natale Lay, MD;  Location: [facility] ORS;  Service: General;  Laterality: N/A;    Current Outpatient Rx  Name  Route  Sig  Dispense  Refill  . promethazine (PHENERGAN) 12.5 MG tablet   Oral   Take 1 tablet (12.5 mg total) by mouth every 6 (six) hours as needed for nausea or vomiting.   30 tablet   0     Allergies Review of patient's allergies indicates no known allergies.  Family History  Problem Relation Age of Onset  . Cancer Mother   . Kidney disease Father     Currently on Dialysis    Social History Social History  Substance Use Topics  . Smoking status: Never Smoker   . Smokeless tobacco: Never Used  . Alcohol Use: No    Review of Systems  Constitutional: Negative for fever. Cardiovascular: Negative for chest  pain. Respiratory: Negative for shortness of breath. Gastrointestinal: Positive for pelvic pain Neurological: Negative for headaches, focal weakness or numbness.  10-point ROS otherwise negative.  ____________________________________________   PHYSICAL EXAM:  VITAL SIGNS: ED Triage Vitals  Enc Vitals Group     BP 11/24/15 1326 118/60 mmHg     Pulse Rate 11/24/15 1326 77     Resp 11/24/15 1326 20     Temp 11/24/15 1326 98 F (36.7 C)     Temp Source 11/24/15 1326 Oral     SpO2 11/24/15 1326 100 %     Weight 11/24/15 1326 160 lb (72.576 kg)     Height 11/24/15 1326  (1.676 m)     Head Cir --      Peak Flow --      Pain Score 11/24/15 1323 9   Constitutional: Alert and oriented. Well appearing and in no distress. Eyes: Conjunctivae are normal. PERRL. Normal extraocular movements. ENT   Head: Normocephalic and atraumatic.   Nose: No congestion/rhinnorhea.   Mouth/Throat: Mucous membranes are moist.   Neck: No stridor. Hematological/Lymphatic/Immunilogical: No cervical lymphadenopathy. Cardiovascular: Normal rate, regular rhythm.  No murmurs, rubs, or gallops. Respiratory: Normal respiratory effort without tachypnea nor retractions. Breath sounds are clear and equal bilaterally. No wheezes/rales/rhonchi. Gastrointestinal: Soft and nontender. No distention. There is no CVA tenderness. Genitourinary: Moderate amount of whitish discharge. No CMT. No adnexal tenderness.  Musculoskeletal: Normal range of motion in all extremities. No joint  effusions.  No lower extremity tenderness nor edema. Neurologic:  Normal speech and language. No gross focal neurologic deficits are appreciated.  Skin:  Skin is warm, dry and intact. No rash noted. Psychiatric: Mood and affect are normal. Speech and behavior are normal. Patient exhibits appropriate insight and judgment.  ____________________________________________    LABS (pertinent positives/negatives)  Labs Reviewed   WET PREP, GENITAL - Abnormal; Notable for the following:    Clue Cells Wet Prep HPF POC POSITIVE (*)    All other components within normal limits  HCG, QUANTITATIVE, PREGNANCY - Abnormal; Notable for the following:    hCG, Beta Chain, Quant, S 42790 (*)    All other components within normal limits  URINALYSIS COMPLETEWITH MICROSCOPIC ([facility] ONLY) - Abnormal; Notable for the following:    Color, Urine YELLOW (*)    APPearance CLOUDY (*)    Leukocytes, UA TRACE (*)    Squamous Epithelial / LPF TOO NUMEROUS TO COUNT (*)    All other components within normal limits  CHLAMYDIA/NGC RT PCR ([facility] ONLY)  ABO/RH     ____________________________________________   EKG  None  ____________________________________________    RADIOLOGY  US  IMPRESSION: 1. Maternal left ovary is not identified. Transvaginal imaging had to be terminated prematurely due to patient pain. On transabdominal imaging, no obvious mass or free fluid is identified in the left adnexa. 2. Probable corpus luteum within the right ovary measuring 3.6 x 3 cm. Maternal right ovary appears otherwise normal and there is no mass or free fluid identified in the right adnexal region. 3. Single live intrauterine pregnancy with estimated gestational age of [redacted] weeks and 1 day. Fetal heart rate measured at 144 beats per minute.  ____________________________________________   PROCEDURES  Procedure(s) performed: None  Critical Care performed: No  ____________________________________________   INITIAL IMPRESSION / ASSESSMENT AND PLAN / ED COURSE  Pertinent labs & imaging results that were available during my care of the patient were reviewed by me and considered in my medical decision making (see chart for details).  Patient presented for pelvic pain n the setting of pregnancy. Wet prep positive for clue cells. US without concerning findings. Doubt PID given lack of CMT. Will have patient follow up with  ob/gyn.  ____________________________________________   FINAL CLINICAL IMPRESSION(S) / ED DIAGNOSES  Final diagnoses:  Left adnexal tenderness  Bacterial vaginosis  Pregnant     Phineas SemenGraydon Yechezkel Fertig, MD 11/24/15 [phone]

## 2015-11-24 NOTE — Discharge Instructions (Signed)
Please seek medical attention for any high fevers, chest pain, shortness of breath, change in behavior, persistent vomiting, bloody stool or any other new or concerning symptoms.   Bacterial Vaginosis Bacterial vaginosis is an infection of the vagina. It happens when too many germs (bacteria) grow in the vagina. Having this infection puts you at risk for getting other infections from sex. Treating this infection can help lower your risk for other infections, such as:   Chlamydia.  Gonorrhea.  HIV.  Herpes. HOME CARE  Take your medicine as told by your doctor.  Finish your medicine even if you start to feel better.  Tell your sex partner that you have an infection. They should see their doctor for treatment.  During treatment:  Avoid sex or use condoms correctly.  Do not douche.  Do not drink alcohol unless your doctor tells you it is ok.  Do not breastfeed unless your doctor tells you it is ok. GET HELP IF:  You are not getting better after 3 days of treatment.  You have more grey fluid (discharge) coming from your vagina than before.  You have more pain than before.  You have a fever. MAKE SURE YOU:   Understand these instructions.  Will watch your condition.  Will get help right away if you are not doing well or get worse.   This information is not intended to replace advice given to you by your health care provider. Make sure you discuss any questions you have with your health care provider.   Document Released: 06/04/2008 Document Revised: 09/16/2014 Document Reviewed: 04/07/2013 Elsevier Interactive Patient Education 2016 [employer]  Prenatal Care WHAT IS PRENATAL CARE?  Prenatal care is the process of caring for a pregnant woman before she gives birth. Prenatal care makes sure that she and her baby remain as healthy as possible throughout pregnancy. Prenatal care may be provided by a midwife, family practice health care provider, or a childbirth and  pregnancy specialist (obstetrician). Prenatal care may include physical examinations, testing, treatments, and education on nutrition, lifestyle, and social support services. WHY IS PRENATAL CARE SO IMPORTANT?  Early and consistent prenatal care increases the chance that you and your baby will remain healthy throughout your pregnancy. This type of care also decreases a baby's risk of being born too early (prematurely), or being born smaller than expected (small for gestational age). Any underlying medical conditions you may have that could pose a risk during your pregnancy are discussed during prenatal care visits. You will also be monitored regularly for any new conditions that may arise during your pregnancy so they can be treated quickly and effectively. WHAT HAPPENS DURING PRENATAL CARE VISITS? Prenatal care visits may include the following: Discussion Tell your health care provider about any new signs or symptoms you have experienced since your last visit. These might include:  Nausea or vomiting.  Increased or decreased level of energy.  Difficulty sleeping.  Back or leg pain.  Weight changes.  Frequent urination.  Shortness of breath with physical activity.  Changes in your skin, such as the development of a rash or itchiness.  Vaginal discharge or bleeding.  Feelings of excitement or nervousness.  Changes in your baby's movements. You may want to write down any questions or topics you want to discuss with your health care provider and bring them with you to your appointment. Examination During your first prenatal care visit, you will likely have a complete physical exam. Your health care provider will often examine your  vagina, cervix, and the position of your uterus, as well as check your heart, lungs, and other body systems. As your pregnancy progresses, your health care provider will measure the size of your uterus and your baby's position inside your uterus. He or she may  also examine you for early signs of labor. Your prenatal visits may also include checking your blood pressure and, after about 10-12 weeks of pregnancy, listening to your baby's heartbeat. Testing Regular testing often includes:  Urinalysis. This checks your urine for glucose, protein, or signs of infection.  Blood count. This checks the levels of white and red blood cells in your body.  Tests for sexually transmitted infections (STIs). Testing for STIs at the beginning of pregnancy is routinely done and is required in many states.  Antibody testing. You will be checked to see if you are immune to certain illnesses, such as rubella, that can affect a developing fetus.  Glucose screen. Around 24-28 weeks of pregnancy, your blood glucose level will be checked for signs of gestational diabetes. Follow-up tests may be recommended.  Group B strep. This is a bacteria that is commonly found inside a woman's vagina. This test will inform your health care provider if you need an antibiotic to reduce the amount of this bacteria in your body prior to labor and childbirth.  Ultrasound. Many pregnant women undergo an ultrasound screening around 18-20 weeks of pregnancy to evaluate the health of the fetus and check for any developmental abnormalities.  HIV (human immunodeficiency virus) testing. Early in your pregnancy, you will be screened for HIV. If you are at high risk for HIV, this test may be repeated during your third trimester of pregnancy. You may be offered other testing based on your age, personal or family medical history, or other factors.  HOW OFTEN SHOULD I PLAN TO SEE MY HEALTH CARE PROVIDER FOR PRENATAL CARE? Your prenatal care check-up schedule depends on any medical conditions you have before, or develop during, your pregnancy. If you do not have any underlying medical conditions, you will likely be seen for checkups:  Monthly, during the first 6 months of pregnancy.  Twice a month  during months 7 and 8 of pregnancy.  Weekly starting in the 9th month of pregnancy and until delivery. If you develop signs of early labor or other concerning signs or symptoms, you may need to see your health care provider more often. Ask your health care provider what prenatal care schedule is best for you. WHAT CAN I DO TO KEEP MYSELF AND MY BABY AS HEALTHY AS POSSIBLE DURING MY PREGNANCY?  Take a prenatal vitamin containing 400 micrograms (0.4 mg) of folic acid every day. Your health care provider may also ask you to take additional vitamins such as iodine, vitamin D, iron, copper, and zinc.  Take 1500-2000 mg of calcium daily starting at your 20th week of pregnancy until you deliver your baby.  Make sure you are up to date on your vaccinations. Unless directed otherwise by your health care provider:  You should receive a tetanus, diphtheria, and pertussis (Tdap) vaccination between the 27th and 36th week of your pregnancy, regardless of when your last Tdap immunization occurred. This helps protect your baby from whooping cough (pertussis) after he or she is born.  You should receive an annual inactivated influenza vaccine (IIV) to help protect you and your baby from influenza. This can be done at any point during your pregnancy.  Eat a well-rounded diet that includes:  Fresh fruits  and vegetables.  Lean proteins.  Calcium-rich foods such as milk, yogurt, hard cheeses, and dark, leafy greens.  Whole grain breads.  Do noteat seafood high in mercury, including:  Swordfish.  Tilefish.  Shark.  King mackerel.  More than 6 oz tuna per week.  Do not eat:  Raw or undercooked meats or eggs.  Unpasteurized foods, such as soft cheeses (brie, blue, or feta), juices, and milks.  Lunch meats.  Hot dogs that have not been heated until they are steaming.  Drink enough water to keep your urine clear or pale yellow. For many women, this may be 10 or more 8 oz glasses of water each  day. Keeping yourself hydrated helps deliver nutrients to your baby and may prevent the start of pre-term uterine contractions.  Do not use any tobacco products including cigarettes, chewing tobacco, or electronic cigarettes. If you need help quitting, ask your health care provider.  Do not drink beverages containing alcohol. No safe level of alcohol consumption during pregnancy has been determined.  Do not use any illegal drugs. These can harm your developing baby or cause a miscarriage.  Ask your health care provider or pharmacist before taking any prescription or over-the-counter medicines, herbs, or supplements.  Limit your caffeine intake to no more than 200 mg per day.  Exercise. Unless told otherwise by your health care provider, try to get 30 minutes of moderate exercise most days of the week. Do not  do high-impact activities, contact sports, or activities with a high risk of falling, such as horseback riding or downhill skiing.  Get plenty of rest.  Avoid anything that raises your body temperature, such as hot tubs and saunas.  If you own a cat, do not empty its litter box. Bacteria contained in cat feces can cause an infection called toxoplasmosis. This can result in serious harm to the fetus.  Stay away from chemicals such as insecticides, lead, mercury, and cleaning or paint products that contain solvents.  Do not have any X-rays taken unless medically necessary.  Take a childbirth and breastfeeding preparation class. Ask your health care provider if you need a referral or recommendation.   This information is not intended to replace advice given to you by your health care provider. Make sure you discuss any questions you have with your health care provider.   Document Released: 08/29/2003 Document Revised: 09/16/2014 Document Reviewed: 11/10/2013 Elsevier Interactive Patient Education [employer]

## 2015-11-24 NOTE — ED Notes (Signed)
Thinks she is approx 4 months preg  Developed some LLQ pain and vaginal spotting 2 days ago  Now having some dysuria and bleeding was heavier last pm

## 2015-11-29 ENCOUNTER — Other Ambulatory Visit: Payer: Self-pay | Admitting: Family Medicine

## 2015-11-29 ENCOUNTER — Ambulatory Visit
Admission: RE | Admit: 2015-11-29 | Discharge: 2015-11-29 | Disposition: A | Discharge status: Home / Self Care | Payer: Medicaid Other | Source: Ambulatory Visit | Attending: Family Medicine | Admitting: Family Medicine

## 2015-11-29 DIAGNOSIS — R1032 Left lower quadrant pain: Secondary | ICD-10-CM

## 2015-11-29 DIAGNOSIS — I82429 Acute embolism and thrombosis of unspecified iliac vein: Secondary | ICD-10-CM | POA: Diagnosis present

## 2015-11-29 DIAGNOSIS — O26892 Other specified pregnancy related conditions, second trimester: Secondary | ICD-10-CM | POA: Insufficient documentation

## 2015-11-29 DIAGNOSIS — Z3A16 16 weeks gestation of pregnancy: Secondary | ICD-10-CM | POA: Diagnosis not present

## 2015-11-29 DIAGNOSIS — I8290 Acute embolism and thrombosis of unspecified vein: Secondary | ICD-10-CM

## 2015-11-30 ENCOUNTER — Other Ambulatory Visit: Payer: Self-pay | Admitting: Family Medicine

## 2015-11-30 DIAGNOSIS — I82429 Acute embolism and thrombosis of unspecified iliac vein: Secondary | ICD-10-CM

## 2015-12-04 ENCOUNTER — Other Ambulatory Visit (HOSPITAL_COMMUNITY): Payer: Self-pay | Admitting: Family Medicine

## 2015-12-04 DIAGNOSIS — I82529 Chronic embolism and thrombosis of unspecified iliac vein: Secondary | ICD-10-CM

## 2015-12-05 ENCOUNTER — Ambulatory Visit (HOSPITAL_COMMUNITY): Admission: RE | Admit: 2015-12-05 | Payer: Medicaid Other | Source: Ambulatory Visit

## 2015-12-22 ENCOUNTER — Ambulatory Visit: Admission: RE | Admit: 2015-12-22 | Payer: Medicaid Other | Source: Ambulatory Visit

## 2015-12-27 ENCOUNTER — Ambulatory Visit
Admission: RE | Admit: 2015-12-27 | Discharge: 2015-12-27 | Disposition: A | Discharge status: Home / Self Care | Payer: Medicaid Other | Source: Ambulatory Visit | Attending: Family Medicine | Admitting: Family Medicine

## 2015-12-27 DIAGNOSIS — I82529 Chronic embolism and thrombosis of unspecified iliac vein: Secondary | ICD-10-CM

## 2015-12-29 ENCOUNTER — Ambulatory Visit
Admission: RE | Admit: 2015-12-29 | Discharge: 2015-12-29 | Disposition: A | Discharge status: Home / Self Care | Payer: Medicaid Other | Source: Ambulatory Visit | Attending: Family Medicine | Admitting: Family Medicine

## 2015-12-29 DIAGNOSIS — R1032 Left lower quadrant pain: Secondary | ICD-10-CM | POA: Insufficient documentation

## 2015-12-29 DIAGNOSIS — O26892 Other specified pregnancy related conditions, second trimester: Secondary | ICD-10-CM | POA: Insufficient documentation

## 2015-12-29 DIAGNOSIS — Z3A16 16 weeks gestation of pregnancy: Secondary | ICD-10-CM | POA: Diagnosis not present

## 2016-01-08 ENCOUNTER — Observation Stay
Admission: EM | Admit: 2016-01-08 | Discharge: 2016-01-08 | Disposition: A | Discharge status: Home / Self Care | Payer: Medicaid Other | Attending: Obstetrics and Gynecology | Admitting: Obstetrics and Gynecology

## 2016-01-08 DIAGNOSIS — O9989 Other specified diseases and conditions complicating pregnancy, childbirth and the puerperium: Secondary | ICD-10-CM | POA: Diagnosis not present

## 2016-01-08 DIAGNOSIS — R102 Pelvic and perineal pain: Secondary | ICD-10-CM | POA: Insufficient documentation

## 2016-01-08 DIAGNOSIS — O0932 Supervision of pregnancy with insufficient antenatal care, second trimester: Secondary | ICD-10-CM | POA: Diagnosis not present

## 2016-01-08 DIAGNOSIS — O26899 Other specified pregnancy related conditions, unspecified trimester: Secondary | ICD-10-CM

## 2016-01-08 DIAGNOSIS — Z3A22 22 weeks gestation of pregnancy: Secondary | ICD-10-CM | POA: Insufficient documentation

## 2016-01-08 LAB — URINE DRUG SCREEN, QUALITATIVE (ARMC ONLY)
AMPHETAMINES, UR SCREEN: NOT DETECTED
BENZODIAZEPINE, UR SCRN: NOT DETECTED
Barbiturates, Ur Screen: NOT DETECTED
Cannabinoid 50 Ng, Ur ~~LOC~~: POSITIVE — AB
Cocaine Metabolite,Ur ~~LOC~~: NOT DETECTED
MDMA (Ecstasy)Ur Screen: NOT DETECTED
METHADONE SCREEN, URINE: NOT DETECTED
Opiate, Ur Screen: NOT DETECTED
Phencyclidine (PCP) Ur S: NOT DETECTED
Tricyclic, Ur Screen: NOT DETECTED

## 2016-01-08 LAB — WET PREP, GENITAL
CLUE CELLS WET PREP: NONE SEEN
Sperm: NONE SEEN
TRICH WET PREP: NONE SEEN
YEAST WET PREP: NONE SEEN

## 2016-01-08 LAB — URINALYSIS COMPLETE WITH MICROSCOPIC (ARMC ONLY)
BACTERIA UA: NONE SEEN
BILIRUBIN URINE: NEGATIVE
GLUCOSE, UA: NEGATIVE mg/dL
HGB URINE DIPSTICK: NEGATIVE
Leukocytes, UA: NEGATIVE
NITRITE: NEGATIVE
Protein, ur: NEGATIVE mg/dL
Specific Gravity, Urine: 1.004 — ABNORMAL LOW (ref 1.005–1.030)
pH: 7 (ref 5.0–8.0)

## 2016-01-08 LAB — TYPE AND SCREEN
ABO/RH(D): O POS
ANTIBODY SCREEN: NEGATIVE

## 2016-01-08 LAB — CHLAMYDIA/NGC RT PCR (ARMC ONLY)
CHLAMYDIA TR: NOT DETECTED
N GONORRHOEAE: NOT DETECTED

## 2016-01-08 NOTE — Final Progress Note (Signed)
Physician Final Progress Note  Patient ID: Tracy Glover MRN: [id number] DOB/AGE: 1991-08-12 24 y.o.  Admit date: 01/08/2016 Admitting provider: Tresea Mall, CNM Discharge date: 01/08/2016   Admission Diagnoses: IUP at 22 weeks, Acute pelvic pain, Insufficient prenatal care to date  Discharge Diagnoses:  Active Problems:   Labor and delivery indication for care or intervention   Pelvic pain affecting pregnancy/pain level improved   Insufficient prenatal care  Consults: None  Significant Findings/ Diagnostic Studies: labs:  Results for Tracy, Glover (MRN [id number]) as of 01/08/2016 15:58  Ref. Range 01/08/2016 14:05  Yeast Wet Prep HPF POC Latest Ref Range: NONE SEEN  NONE SEEN  Trich, Wet Prep Latest Ref Range: NONE SEEN  NONE SEEN  Clue Cells Wet Prep HPF POC Latest Ref Range: NONE SEEN  NONE SEEN  WBC, Wet Prep HPF POC Latest Ref Range: NONE SEEN  FEW (A)  Appearance Latest Ref Range: CLEAR  CLEAR (A)  Bacteria, UA Latest Ref Range: NONE SEEN  NONE SEEN  Bilirubin Urine Latest Ref Range: NEGATIVE  NEGATIVE  Color, Urine Latest Ref Range: YELLOW  STRAW (A)  Glucose Latest Ref Range: NEGATIVE mg/dL NEGATIVE  Hgb urine dipstick Latest Ref Range: NEGATIVE  NEGATIVE  Ketones, ur Latest Ref Range: NEGATIVE mg/dL TRACE (A)  Leukocytes, UA Latest Ref Range: NEGATIVE  NEGATIVE  Nitrite Latest Ref Range: NEGATIVE  NEGATIVE  pH Latest Ref Range: 5.0-8.0  7.0  Protein Latest Ref Range: NEGATIVE mg/dL NEGATIVE  RBC / HPF Latest Ref Range: 0-5 RBC/hpf 0-5  Specific Gravity, Urine Latest Ref Range: 1.005-1.030  1.004 (L)  Squamous Epithelial / LPF Latest Ref Range: NONE SEEN  0-5 (A)  WBC, UA Latest Ref Range: 0-5 WBC/hpf 0-5  Amphetamines, Ur Screen Latest Ref Range: NONE DETECTED  NONE DETECTED  Barbiturates, Ur Screen Latest Ref Range: NONE DETECTED  NONE DETECTED  Benzodiazepine, Ur Scrn Latest Ref Range: NONE DETECTED  NONE DETECTED  Cocaine Metabolite,Ur [place] Latest Ref  Range: NONE DETECTED  NONE DETECTED  Methadone Scn, Ur Latest Ref Range: NONE DETECTED  NONE DETECTED  MDMA (Ecstasy)Ur Screen Latest Ref Range: NONE DETECTED  NONE DETECTED  Cannabinoid 50 Ng, Ur [place] Latest Ref Range: NONE DETECTED  POSITIVE (A)  Opiate, Ur Screen Latest Ref Range: NONE DETECTED  NONE DETECTED  Phencyclidine (PCP) Ur S Latest Ref Range: NONE DETECTED  NONE DETECTED  Tricyclic, Ur Screen Latest Ref Range: NONE DETECTED  NONE DETECTED  URINE CULTURE Unknown Rpt  WET PREP, GENITAL Unknown Rpt (A)    Procedures:  Fetal monitoring: baseline 140's Toco: negative contractions Cervix: closed/thick/high Thick, white vaginal discharge suspicious for yeast. Negative per lab. Pt declines repeat swab with look under our microscope.  Plan:  Pt states she has an appointment tomorrow with [facility] to begin prenatal care.  Encouraged to keep appointment and recommended she speak with her care provider about her visit to L&D today. Encouraged cessation of Marijuana use. Encouraged increased hydration  Discharge Condition: good  Disposition: 01-Home or Self Care  Diet: Regular diet  Discharge Activity: Activity as tolerated      Discharge Instructions    Discharge activity:  No Restrictions    Complete by:  As directed      Discharge diet:  No restrictions    Complete by:  As directed      No sexual activity restrictions    Complete by:  As directed      Notify physician for a general feeling that "  something is not right"    Complete by:  As directed      Notify physician for increase or change in vaginal discharge    Complete by:  As directed      Notify physician for intestinal cramps, with or without diarrhea, sometimes described as "gas pain"    Complete by:  As directed      Notify physician for leaking of fluid    Complete by:  As directed      Notify physician for low, dull backache, unrelieved by heat or Tylenol    Complete by:  As directed      Notify  physician for menstrual like cramps    Complete by:  As directed      Notify physician for pelvic pressure    Complete by:  As directed      Notify physician for uterine contractions.  These may be painless and feel like the uterus is tightening or the baby is  "balling up"    Complete by:  As directed      Notify physician for vaginal bleeding    Complete by:  As directed      PRETERM LABOR:  Includes any of the follwing symptoms that occur between 20 - [redacted] weeks gestation.  If these symptoms are not stopped, preterm labor can result in preterm delivery, placing your baby at risk    Complete by:  As directed             Medication List    STOP taking these medications        promethazine 12.5 MG tablet  Commonly known as:  PHENERGAN       Follow-up Information    Go to to follow up.   Why:  your appointment at [facility] to initiate prenatal care tomorrow      Total time spent taking care of this patient: 30 minutes  Signed: Tresea MallGLEDHILL,Adyson Vanburen, CNM

## 2016-01-08 NOTE — Progress Notes (Signed)
Discharge instructions reviewed with patient who verbalized understanding.  Discharged home in stable, ambulatory condition.   

## 2016-01-08 NOTE — OB Triage Note (Signed)
Patient presented from the ED complaining of constant vaginal pain and pressure that started this morning.  Patient has had no prenatal care except for visits to the ED where US were performed.  Patient is unsure of LMP/ EDC, UDC of 05/13/16 is on US report from the ED visit.  Patient denies vaginal bleeding or leaking of fluid, states she has not felt the baby move in a few days.  Denies any urinary symptoms but states she does have a moderate amount of white vaginal discharge.

## 2016-01-08 NOTE — Progress Notes (Signed)
Pt arrived from the ED with acute pelvic pain onset this am. She is a pt at [facility] and has not yet initiated prenatal care. She has had an ultrasound on 11/29/2015 that dated her pregnancy with an EDD of 9/4. Her LMP was 08/02/15. According to the U/S dating she is 22 weeks today.   Pt states she is having constant pelvic pain since this morning. She has not felt the baby move today and is not sure when she last felt the baby. She denies leakage of fluid. She denies urinary/gyn symptoms. Pt reports last intercourse was 2 days ago. She says she feels like something is going to "pop".  Unable to obtain heart tones with doppler. U/S guidance with good heart tones and fetus put on the monitor. Baseline 140 with moderate variability.  On exam pt has thick white vaginal discharge. Specimen obtained for wet prep. Cervix is closed/thick/high.  Assessment/Plan IUP at 22 weeks with acute pelvic pain  1. Wet Prep 2. UA and Urine Cx 3. GC/CT 4. RPR 5. Urine Drug Screen  Dandra Velardi, CNM

## 2016-01-09 LAB — RPR: RPR Ser Ql: NONREACTIVE

## 2016-01-10 LAB — URINE CULTURE

## 2016-01-16 LAB — OB RESULTS CONSOLE HEPATITIS B SURFACE ANTIGEN: Hepatitis B Surface Ag: NEGATIVE

## 2016-01-16 LAB — OB RESULTS CONSOLE VARICELLA ZOSTER ANTIBODY, IGG: VARICELLA IGG: IMMUNE

## 2016-01-16 LAB — OB RESULTS CONSOLE RUBELLA ANTIBODY, IGM: Rubella: IMMUNE

## 2016-01-16 LAB — OB RESULTS CONSOLE HIV ANTIBODY (ROUTINE TESTING): HIV: NONREACTIVE

## 2016-03-19 ENCOUNTER — Observation Stay
Admission: EM | Admit: 2016-03-19 | Discharge: 2016-03-19 | Disposition: A | Discharge status: Home / Self Care | Payer: Medicaid Other | Attending: Obstetrics and Gynecology | Admitting: Obstetrics and Gynecology

## 2016-03-19 ENCOUNTER — Encounter: Payer: Self-pay | Admitting: Advanced Practice Midwife

## 2016-03-19 DIAGNOSIS — R1030 Lower abdominal pain, unspecified: Secondary | ICD-10-CM | POA: Diagnosis not present

## 2016-03-19 DIAGNOSIS — O9989 Other specified diseases and conditions complicating pregnancy, childbirth and the puerperium: Secondary | ICD-10-CM | POA: Diagnosis not present

## 2016-03-19 DIAGNOSIS — Z3A32 32 weeks gestation of pregnancy: Secondary | ICD-10-CM | POA: Insufficient documentation

## 2016-03-19 DIAGNOSIS — R109 Unspecified abdominal pain: Secondary | ICD-10-CM

## 2016-03-19 DIAGNOSIS — O26899 Other specified pregnancy related conditions, unspecified trimester: Secondary | ICD-10-CM

## 2016-03-19 LAB — URINALYSIS COMPLETE WITH MICROSCOPIC (ARMC ONLY)
Bilirubin Urine: NEGATIVE
GLUCOSE, UA: NEGATIVE mg/dL
HGB URINE DIPSTICK: NEGATIVE
Ketones, ur: NEGATIVE mg/dL
LEUKOCYTES UA: NEGATIVE
Nitrite: NEGATIVE
PROTEIN: 30 mg/dL — AB
Specific Gravity, Urine: 1.027 (ref 1.005–1.030)
pH: 6 (ref 5.0–8.0)

## 2016-03-19 NOTE — OB Triage Note (Signed)
recvd to obs4 per wheelchair from ED. Changed to gown and to bed.  EFM applied.  Plan of care discussed and oriented to room.  C/O contractions since 2200 last PM.

## 2016-03-19 NOTE — Plan of Care (Signed)
Dressed for discharge.  Discharge instructions given and explained.  Verbalized understanding.  Signed copy on chart and one in hand.

## 2016-03-19 NOTE — Discharge Summary (Signed)
Obstetric History and Physical  Tracy Glover is a 25 y.o. [phone] with Estimated Date of Delivery: 05/13/16 per LMP c/w 7 wk US who presents at 2127w1d  presenting for low abdominal pain since last night. Reports pain was worse last night. Last intercourse 2 days ago. Patient reports intact membranes, no vaginal bleeding with active fetal movement.  Admits to low po fluid intake.   Prenatal Course Source of Care: [facility]  with onset of care at 21 weeks Pregnancy complications or risks: Late to care- + MJ on UDS H/o UTI in early pregnancy Prior CS x 2 - desires BTL   Patient Active Problem List   Diagnosis Date Noted  . Abdominal pain affecting pregnancy 03/19/2016  . Labor and delivery indication for care or intervention 01/08/2016  . Pelvic pain affecting pregnancy 01/08/2016  . Acute cholecystitis 05/18/2015    Prenatal Transfer Tool   Past Medical History  Diagnosis Date  . Cholecystitis     Past Surgical History  Procedure Laterality Date  . Cholecystectomy N/A 05/19/2015    Procedure: LAPAROSCOPIC CHOLECYSTECTOMY;  Surgeon: Natale LayMark Bird, MD;  Location: [facility] ORS;  Service: General;  Laterality: N/A;  . Cesarean section      OB History  Gravida Para Term Preterm AB SAB TAB Ectopic Multiple Living  4 2 2  1  1   2     # Outcome Date GA Lbr Len/2nd Weight Sex Delivery Anes PTL Lv  4 Current           3 Term 03/01/12     Margaretmary EddyCS-LTranv   Y  2 Term 08/09/98     CS-LTranv   Y  1 TAB               Social History   Social History  . Marital Status: Single    Spouse Name: N/A  . Number of Children: N/A  . Years of Education: N/A   Social History Main Topics  . Smoking status: Never Smoker   . Smokeless tobacco: Never Used  . Alcohol Use: No  . Drug Use: No  . Sexual Activity: Yes   Other Topics Concern  . None   Social History Narrative    Family History  Problem Relation Age of Onset  . Cancer Mother   . Kidney disease Father     Currently on Dialysis  Anemia -  brother  No prescriptions prior to admission    No Known Allergies  Review of Systems: Negative except for what is mentioned in HPI.  Physical Exam: BP 110/61 mmHg  Pulse 77  Temp(Src) 98.2 F (36.8 C) (Oral)  Resp 20  Ht 5\' 8"  (1.727 m)  Wt 200 lb (90.719 kg)  BMI 30.42 kg/m2  LMP 08/02/2015 GENERAL: Well-developed, well-nourished female in no acute distress.  LUNGS: Clear to auscultation bilaterally.  HEART: Regular rate and rhythm. ABDOMEN: Soft, nontender, nondistended, gravid. EXTREMITIES: Nontender, no edema Cervical Exam: Dilatation 0 cm   Effacement 0 %   Station high   FHT: Category: 1  Baseline rate 135 bpm   Variability moderate  Accelerations present   Decelerations none Contractions: none   Pertinent Labs/Studies:   No results found for this or any previous visit (from the past 24 hour(s)).  Assessment : IUP at 5727w1d, no evidence of PTL  Plan: UA ordered  PO hydration here - rec to continue at home Rec pelvic rest x 1 week D/c home once UA resulted (RN to f/u with Dr Jean RosenthalJackson  with any abnormalities)

## 2016-03-24 ENCOUNTER — Observation Stay
Admission: EM | Admit: 2016-03-24 | Discharge: 2016-03-24 | Disposition: A | Discharge status: Home / Self Care | Payer: Medicaid Other | Attending: Advanced Practice Midwife | Admitting: Advanced Practice Midwife

## 2016-03-24 DIAGNOSIS — O4703 False labor before 37 completed weeks of gestation, third trimester: Principal | ICD-10-CM | POA: Insufficient documentation

## 2016-03-24 DIAGNOSIS — Z3A32 32 weeks gestation of pregnancy: Secondary | ICD-10-CM | POA: Insufficient documentation

## 2016-03-24 DIAGNOSIS — O9989 Other specified diseases and conditions complicating pregnancy, childbirth and the puerperium: Secondary | ICD-10-CM | POA: Diagnosis present

## 2016-03-24 NOTE — Discharge Summary (Signed)
Discharge Note:   Tracy Glover is a 25 y.o. [phone] with Estimated Date of Delivery: 05/13/16 per who presents at 2834w6d  presenting for c/o cramping. Patient states she has been having no vaginal bleeding, intact membranes, with active fetal movement.    Pt was examined by RN and verbal report given to me.  Cervix closed/ballotable No evidence of preterm labor, so pt was discharged home.

## 2016-03-24 NOTE — OB Triage Note (Signed)
Pt. states that she has been having contractions for the past 24 hours but is not sure if they are "the real thing." Reports a hx of ptl and c/sx2. Endorses good fetal movement; denies LOF, VB. Denies recent sex.

## 2016-04-18 ENCOUNTER — Encounter: Payer: Self-pay | Admitting: *Deleted

## 2016-04-18 ENCOUNTER — Observation Stay
Admission: EM | Admit: 2016-04-18 | Discharge: 2016-04-18 | Disposition: A | Discharge status: Home / Self Care | Payer: Medicaid Other | Attending: Obstetrics & Gynecology | Admitting: Obstetrics & Gynecology

## 2016-04-18 DIAGNOSIS — R109 Unspecified abdominal pain: Secondary | ICD-10-CM | POA: Diagnosis not present

## 2016-04-18 DIAGNOSIS — O26893 Other specified pregnancy related conditions, third trimester: Principal | ICD-10-CM | POA: Insufficient documentation

## 2016-04-18 DIAGNOSIS — E860 Dehydration: Secondary | ICD-10-CM | POA: Insufficient documentation

## 2016-04-18 DIAGNOSIS — Z3A36 36 weeks gestation of pregnancy: Secondary | ICD-10-CM | POA: Insufficient documentation

## 2016-04-18 DIAGNOSIS — O26899 Other specified pregnancy related conditions, unspecified trimester: Secondary | ICD-10-CM

## 2016-04-18 LAB — COMPREHENSIVE METABOLIC PANEL
ALK PHOS: 97 U/L (ref 38–126)
ALT: 13 U/L — AB (ref 14–54)
AST: 19 U/L (ref 15–41)
Albumin: 3.1 g/dL — ABNORMAL LOW (ref 3.5–5.0)
Anion gap: 7 (ref 5–15)
BILIRUBIN TOTAL: 0.5 mg/dL (ref 0.3–1.2)
BUN: 6 mg/dL (ref 6–20)
CALCIUM: 8.6 mg/dL — AB (ref 8.9–10.3)
CO2: 22 mmol/L (ref 22–32)
CREATININE: 0.56 mg/dL (ref 0.44–1.00)
Chloride: 105 mmol/L (ref 101–111)
GFR calc Af Amer: >60 mL/min (ref >60)
Glucose, Bld: 77 mg/dL (ref 65–99)
Potassium: 3.7 mmol/L (ref 3.5–5.1)
Sodium: 134 mmol/L — ABNORMAL LOW (ref 135–145)
TOTAL PROTEIN: 6.8 g/dL (ref 6.5–8.1)

## 2016-04-18 LAB — CBC
HCT: 34.6 % — ABNORMAL LOW (ref 35.0–47.0)
Hemoglobin: 12.1 g/dL (ref 12.0–16.0)
MCH: 30.2 pg (ref 26.0–34.0)
MCHC: 35 g/dL (ref 32.0–36.0)
MCV: 86.3 fL (ref 80.0–100.0)
PLATELETS: 253 10*3/uL (ref 150–440)
RBC: 4.01 MIL/uL (ref 3.80–5.20)
RDW: 12.8 % (ref 11.5–14.5)
WBC: 8.5 10*3/uL (ref 3.6–11.0)

## 2016-04-18 MED ORDER — ACETAMINOPHEN 325 MG PO TABS: 650.0000 mg | ORAL_TABLET | ORAL | Status: DC | PRN

## 2016-04-18 MED ORDER — LACTATED RINGERS IV SOLN
Freq: Once | INTRAVENOUS | Status: AC
  Administered 2016-04-18: 13:00:00 via INTRAVENOUS

## 2016-04-18 MED ORDER — CALCIUM CARBONATE ANTACID 500 MG PO CHEW: 2.0000 | CHEWABLE_TABLET | ORAL | Status: DC | PRN

## 2016-04-18 NOTE — Discharge Summary (Signed)
Physician Discharge Summary  Patient ID: Sharen HintBrittany N Linders MRN: [id number] DOB/AGE: 12-29-1990 25 y.o.  Admit date: 04/18/2016 Discharge date: 04/18/2016  Admission Diagnoses:  Discharge Diagnoses:  Active Problems:   Abdominal pain affecting pregnancy   Discharged Condition: good  Hospital Course: Dehydration as Discharge Diagnosis. Pregnancy 36 weeks.  Consults: None  Significant Diagnostic Studies: labs: CBC normal and Electrolytes. A NST procedure was performed with FHR monitoring and a normal baseline established, appropriate time of 20-40 minutes of evaluation, and accels >2 seen w 15x15 characteristics.  Results show a REACTIVE NST.  No signs of PTL.  Cervical exam- closed.  Treatments: IV hydration  Discharge Exam: Blood pressure 109/67, pulse 89, temperature 97.6 F (36.4 C), temperature source Oral, resp. rate 16, height 5\' 6"  (1.676 m), weight 200 lb (90.7 kg), last menstrual period 08/02/2015. General appearance: alert, cooperative and no distress GI: gravid NT  Disposition: 01-Home or Self Care     Medication List    You have not been prescribed any medications.      Signed: Letitia Libraobert Paul Tmya Wigington 04/18/2016, 1:58 PM

## 2016-04-18 NOTE — H&P (Signed)
Obstetric History and Physical  Tracy Glover is a 25 y.o. [id number] with Estimated Date of Delivery: 05/13/16 who presents at 3875w3d  presenting for abdominal pain and dizzyness. Patient states she has been having irregular contractions, no vaginal bleeding, intact membranes, with active fetal movement.    Prenatal Course Source of Care: [facility]  with onset of care at 21 weeks Pregnancy complications or risks: Late to care Prior c/s x 2 THC use Anemia at NOB labs (normal H&H at 28 wks)  ? H/o preterm birth  Patient Active Problem List   Diagnosis Date Noted  . Indication for care in labor or delivery 03/24/2016  . Abdominal pain affecting pregnancy 03/19/2016  . Labor and delivery indication for care or intervention 01/08/2016  . Pelvic pain affecting pregnancy 01/08/2016  . Acute cholecystitis 05/18/2015     Prenatal Transfer Tool   PMHx: Cholecystitis  Past Surgical History:  Procedure Laterality Date  . CESAREAN SECTION    . CHOLECYSTECTOMY N/A 05/19/2015   Procedure: LAPAROSCOPIC CHOLECYSTECTOMY;  Surgeon: Natale LayMark Bird, MD;  Location: [facility] ORS;  Service: General;  Laterality: N/A;    OB History  Gravida Para Term Preterm AB Living  4 2 0 2 1 2   SAB TAB Ectopic Multiple Live Births    1     2    # Outcome Date GA Lbr Len/2nd Weight Sex Delivery Anes PTL Lv  4 Current           3 Preterm 03/01/12     CS-LTranv   LIV  2 Preterm 08/09/98     CS-LTranv   LIV  1 TAB               Social History   Social History  . Marital status: Single    Spouse name: N/A  . Number of children: N/A  . Years of education: N/A   Social History Main Topics  . Smoking status: Never Smoker  . Smokeless tobacco: Never Used  . Alcohol use No  . Drug use: No  . Sexual activity: Yes    Birth control/ protection: Surgical   Other Topics Concern  . None   Social History Narrative  . None    Family History  Problem Relation Age of Onset  . Cancer Mother   . Kidney disease Father      Currently on Dialysis    No prescriptions prior to admission.    No Known Allergies  Review of Systems: Negative except for what is mentioned in HPI.  Physical Exam: BP 109/67 (BP Location: Left Arm)   Pulse 89   Temp 97.6 F (36.4 C) (Oral)   Resp 16   LMP 08/02/2015   Pulse Ox: 100% GENERAL: Well-developed, well-nourished female in no acute distress.  LUNGS: Clear to auscultation bilaterally.  HEART: Regular rate and rhythm. ABDOMEN: Soft, nontender, nondistended, gravid. EXTREMITIES: Nontender, no edema Cervical Exam: Dilatation 0 cm   Effacement  0%   Station -2   Presentation: cephalic FHT: Category: 1 Baseline rate 130 bpm   Variability moderate  Accelerations present   Decelerations none Contractions: rare   Pertinent Labs/Studies:   No results found for this or any previous visit (from the past 24 hour(s)).  Assessment : IUP at 7575w3d, no evidence of preterm labor, dizzyness  Plan: IV fluids and CBC, CMP to assess for cause of dizzyness and Observe for cervical change

## 2016-04-18 NOTE — OB Triage Note (Signed)
"  Sharp" pain in bilateral groin. Started last night. Pain started at work. Once at home and rested pain went away. Pain returned this am while at work, moping floor. Pain is not constant. Reports positive fetal movement. Tracy Glover, Tracy Glover S

## 2016-04-29 ENCOUNTER — Encounter: Payer: Self-pay | Admitting: *Deleted

## 2016-04-29 ENCOUNTER — Observation Stay
Admission: EM | Admit: 2016-04-29 | Discharge: 2016-04-29 | Disposition: A | Discharge status: Home / Self Care | Payer: Medicaid Other | Attending: Certified Nurse Midwife | Admitting: Certified Nurse Midwife

## 2016-04-29 DIAGNOSIS — Z3A38 38 weeks gestation of pregnancy: Secondary | ICD-10-CM | POA: Diagnosis not present

## 2016-04-29 DIAGNOSIS — O163 Unspecified maternal hypertension, third trimester: Secondary | ICD-10-CM | POA: Diagnosis not present

## 2016-04-29 DIAGNOSIS — R51 Headache: Secondary | ICD-10-CM | POA: Insufficient documentation

## 2016-04-29 LAB — PROTEIN / CREATININE RATIO, URINE
CREATININE, URINE: 141 mg/dL
PROTEIN CREATININE RATIO: 0.06 mg/mg{creat} (ref 0.00–0.15)
TOTAL PROTEIN, URINE: 8 mg/dL

## 2016-04-29 LAB — URINE DRUG SCREEN, QUALITATIVE (ARMC ONLY)
Amphetamines, Ur Screen: NOT DETECTED
BARBITURATES, UR SCREEN: NOT DETECTED
BENZODIAZEPINE, UR SCRN: NOT DETECTED
CANNABINOID 50 NG, UR ~~LOC~~: NOT DETECTED
Cocaine Metabolite,Ur ~~LOC~~: NOT DETECTED
MDMA (Ecstasy)Ur Screen: NOT DETECTED
Methadone Scn, Ur: NOT DETECTED
Opiate, Ur Screen: NOT DETECTED
PHENCYCLIDINE (PCP) UR S: NOT DETECTED
TRICYCLIC, UR SCREEN: NOT DETECTED

## 2016-04-29 LAB — CBC
HEMATOCRIT: 34.4 % — AB (ref 35.0–47.0)
HEMOGLOBIN: 11.8 g/dL — AB (ref 12.0–16.0)
MCH: 29.8 pg (ref 26.0–34.0)
MCHC: 34.2 g/dL (ref 32.0–36.0)
MCV: 87.1 fL (ref 80.0–100.0)
Platelets: 241 10*3/uL (ref 150–440)
RBC: 3.95 MIL/uL (ref 3.80–5.20)
RDW: 13.1 % (ref 11.5–14.5)
WBC: 9.9 10*3/uL (ref 3.6–11.0)

## 2016-04-29 LAB — COMPREHENSIVE METABOLIC PANEL
ALBUMIN: 3 g/dL — AB (ref 3.5–5.0)
ALK PHOS: 103 U/L (ref 38–126)
ALT: 11 U/L — ABNORMAL LOW (ref 14–54)
ANION GAP: 4 — AB (ref 5–15)
AST: 17 U/L (ref 15–41)
BILIRUBIN TOTAL: 0.6 mg/dL (ref 0.3–1.2)
BUN: 11 mg/dL (ref 6–20)
CALCIUM: 8.6 mg/dL — AB (ref 8.9–10.3)
CO2: 24 mmol/L (ref 22–32)
Chloride: 108 mmol/L (ref 101–111)
Creatinine, Ser: 0.74 mg/dL (ref 0.44–1.00)
GFR calc non Af Amer: >60 mL/min (ref >60)
Glucose, Bld: 76 mg/dL (ref 65–99)
POTASSIUM: 3.9 mmol/L (ref 3.5–5.1)
Sodium: 136 mmol/L (ref 135–145)
TOTAL PROTEIN: 6.7 g/dL (ref 6.5–8.1)

## 2016-04-29 LAB — TYPE AND SCREEN
ABO/RH(D): O POS
ANTIBODY SCREEN: NEGATIVE

## 2016-04-29 MED ORDER — PRENATAL VITAMIN 27-0.8 MG PO TABS: 1.0000 | ORAL_TABLET | Freq: Every day | ORAL | Status: DC

## 2016-04-29 MED ORDER — ACETAMINOPHEN 500 MG PO TABS
ORAL_TABLET | ORAL | Status: AC
  Administered 2016-04-29: 1000 mg via ORAL
  Filled 2016-04-29: qty 2

## 2016-04-29 MED ORDER — ACETAMINOPHEN 500 MG PO TABS
1000.0000 mg | ORAL_TABLET | Freq: Four times a day (QID) | ORAL | Status: DC | PRN
  Administered 2016-04-29: 1000 mg via ORAL

## 2016-04-29 NOTE — Progress Notes (Signed)
Discharge instructions given and explained.  Verbalized understanding.  Questions answered.  Signed copy on chart and one in hand.

## 2016-04-29 NOTE — Final Progress Note (Signed)
Physician Final Progress Note  Patient ID: Tracy Glover MRN: [id number] DOB/AGE: [birth year] 25 y.o.  Admit date: 04/29/2016 Admitting provider: Nadara Mustardobert P Harris, MD Discharge date: 04/29/2016   Admission Diagnoses: IUP at 38 weeks with elevated blood pressure Headache Prior Cesarean section x 2  Discharge Diagnoses:  IUP at 38 weeks No evidence of preeclampsia  Consults:  Significant Findings/ Diagnostic Studies: 25 year old G4 P1112 with EDC=05/13/2016 by a 7 week ultrasound sent from office for a PIH evaluation after in office BP 140/90 and c/o a frontal headache. Denied visual changes, RUQ pain. Prenatal care has been erratic. Last seen in office at 31 weeks, was seen three times in the L&D since then. [facility] since 21 weeks. PNC remarkable for prior Cesarean section x 2, positive UDS for MJ during pregnancy x 2, and anemia.  BPs : 119/59, 106/74, 111/66, 107/75, 123/65 FHR 130 with accelerations to 150s to 160s, moderate variability Toco: occasional mild ctx Results for orders placed or performed during the hospital encounter of 04/29/16 (from the past 24 hour(s))  Protein / creatinine ratio, urine     Status: None   Collection Time: 04/29/16  3:39 PM  Result Value Ref Range   Creatinine, Urine 141 mg/dL   Total Protein, Urine 8 mg/dL   Protein Creatinine Ratio 0.06 0.00 - 0.15 mg/mg[Cre]  Urine Drug Screen, Qualitative ([facility] only)     Status: None   Collection Time: 04/29/16  3:39 PM  Result Value Ref Range   Tricyclic, Ur Screen NONE DETECTED NONE DETECTED   Amphetamines, Ur Screen NONE DETECTED NONE DETECTED   MDMA (Ecstasy)Ur Screen NONE DETECTED NONE DETECTED   Cocaine Metabolite,Ur [place] NONE DETECTED NONE DETECTED   Opiate, Ur Screen NONE DETECTED NONE DETECTED   Phencyclidine (PCP) Ur S NONE DETECTED NONE DETECTED   Cannabinoid 50 Ng, Ur [place] NONE DETECTED NONE DETECTED   Barbiturates, Ur Screen NONE DETECTED NONE DETECTED   Benzodiazepine,  Ur Scrn NONE DETECTED NONE DETECTED   Methadone Scn, Ur NONE DETECTED NONE DETECTED  Type and screen [facility]     Status: None   Collection Time: 04/29/16  3:51 PM  Result Value Ref Range   ABO/RH(D) O POS    Antibody Screen NEG    Sample Expiration 05/02/2016   CBC     Status: Abnormal   Collection Time: 04/29/16  3:51 PM  Result Value Ref Range   WBC 9.9 3.6 - 11.0 K/uL   RBC 3.95 3.80 - 5.20 MIL/uL   Hemoglobin 11.8 (L) 12.0 - 16.0 g/dL   HCT 86.534.4 (L) 78.435.0 - 69.647.0 %   MCV 87.1 80.0 - 100.0 fL   MCH 29.8 26.0 - 34.0 pg   MCHC 34.2 32.0 - 36.0 g/dL   RDW 29.513.1 28.411.5 - 13.214.5 %   Platelets 241 150 - 440 K/uL  Comprehensive metabolic panel     Status: Abnormal   Collection Time: 04/29/16  3:51 PM  Result Value Ref Range   Sodium 136 135 - 145 mmol/L   Potassium 3.9 3.5 - 5.1 mmol/L   Chloride 108 101 - 111 mmol/L   CO2 24 22 - 32 mmol/L   Glucose, Bld 76 65 - 99 mg/dL   BUN 11 6 - 20 mg/dL   Creatinine, Ser 4.400.74 0.44 - 1.00 mg/dL   Calcium 8.6 (L) 8.9 - 10.3 mg/dL   Total Protein 6.7 6.5 - 8.1 g/dL   Albumin 3.0 (L) 3.5 -  5.0 g/dL   AST 17 15 - 41 U/L   ALT 11 (L) 14 - 54 U/L   Alkaline Phosphatase 103 38 - 126 U/L   Total Bilirubin 0.6 0.3 - 1.2 mg/dL   GFR calc non Af Amer >60 >60 mL/min   GFR calc Af Amer >60 >60 mL/min   Anion gap 4 (L) 5 - 15  A; IUP at 38 weeks with no evidence of preeclampsia Normotensive Prior CS x 2 -put on schedule for 29 August Cat 1 tracing/ reactive NST P: Tylenol for headache\ DC home with preeclampsia precautions FU in office 01 May 1329 for BP with CLG  Procedures: NST  Discharge Condition: stable  Disposition: 01-Home or Self Care  Diet: Regular diet  Discharge Activity: Activity as tolerated     Medication List    TAKE these medications   Prenatal Vitamin 27-0.8 MG Tabs Take 1 tablet by mouth daily.      Follow-up Information    Tracy Glover, CNM Follow up on 05/01/2016.   Specialty:   Certified Nurse Midwife Contact information: [address] [place] [id number] [phone]         FU appt 01 May 1329 [facility]  Total time spent taking care of this patient: 20 minutes  Signed: Farrel ConnersGUTIERREZ, Evalena Fujii 04/29/2016, 6:15 PM

## 2016-04-29 NOTE — OB Triage Note (Signed)
Recvd from the ED after being seen in the office today with C/O HTN and abdominal pain.  Changed to gown and to bed.  EFM applied.  Plan of care discussed and oriented to room.  Verbalized understanding

## 2016-05-06 ENCOUNTER — Encounter
Admission: RE | Admit: 2016-05-06 | Discharge: 2016-05-06 | Disposition: A | Discharge status: Home / Self Care | Payer: Medicaid Other | Source: Ambulatory Visit | Attending: Obstetrics and Gynecology | Admitting: Obstetrics and Gynecology

## 2016-05-06 LAB — CBC
HCT: 35.4 % (ref 35.0–47.0)
Hemoglobin: 12.2 g/dL (ref 12.0–16.0)
MCH: 30 pg (ref 26.0–34.0)
MCHC: 34.6 g/dL (ref 32.0–36.0)
MCV: 86.7 fL (ref 80.0–100.0)
PLATELETS: 240 10*3/uL (ref 150–440)
RBC: 4.08 MIL/uL (ref 3.80–5.20)
RDW: 12.7 % (ref 11.5–14.5)
WBC: 7.5 10*3/uL (ref 3.6–11.0)

## 2016-05-06 LAB — TYPE AND SCREEN
ABO/RH(D): O POS
ANTIBODY SCREEN: NEGATIVE
EXTEND SAMPLE REASON: UNDETERMINED

## 2016-05-06 LAB — RAPID HIV SCREEN (HIV 1/2 AB+AG)
HIV 1/2 Antibodies: NONREACTIVE
HIV-1 P24 Antigen - HIV24: NONREACTIVE

## 2016-05-06 LAB — DIFFERENTIAL
BASOS ABS: 0 10*3/uL (ref 0–0.1)
Basophils Relative: 0 %
EOS PCT: 2 %
Eosinophils Absolute: 0.1 10*3/uL (ref 0–0.7)
LYMPHS PCT: 17 %
Lymphs Abs: 1.3 10*3/uL (ref 1.0–3.6)
Monocytes Absolute: 0.5 10*3/uL (ref 0.2–0.9)
Monocytes Relative: 7 %
NEUTROS ABS: 5.6 10*3/uL (ref 1.4–6.5)
NEUTROS PCT: 74 %

## 2016-05-06 NOTE — Patient Instructions (Signed)
Your procedure is scheduled on: 05/07/16 Report to [facility] AT 7:30 .  Remember: Instructions that are not followed completely may result in serious medical risk, up to and including death, or upon the discretion of your surgeon and anesthesiologist your surgery may need to be rescheduled.    __X__ 1. Do not eat food or drink liquids after midnight. No gum chewing or hard candies.     __X__ 2. No Alcohol for 24 hours before or after surgery.   ____ 3. Bring all medications with you on the day of surgery if instructed.    __X__ 4. Notify your doctor if there is any change in your medical condition     (cold, fever, infections).     Do not wear jewelry, make-up, hairpins, clips or nail polish.  Do not wear lotions, powders, or perfumes.   Do not shave 48 hours prior to surgery. Men may shave face and neck.  Do not bring valuables to the hospital.    [facility] is not responsible for any belongings or valuables.               Contacts, dentures or bridgework may not be worn into surgery.  Leave your suitcase in the car. After surgery it may be brought to your room.  For patients admitted to the hospital, discharge time is determined by your                treatment team.   Patients discharged the day of surgery will not be allowed to drive home.   Please read over the following fact sheets that you were given:   Surgical Site Infection Prevention   ____ Take these medicines the morning of surgery with A SIP OF WATER:    1. NONE  2.   3.   4.  5.  6.  ____ Fleet Enema (as directed)   _X_ Use CHG Soap as directed/SAGE WIPES  ____ Use inhalers on the day of surgery  ____ Stop metformin 2 days prior to surgery    ____ Take 1/2 of usual insulin dose the night before surgery and none on the morning of surgery.   ____ Stop Coumadin/Plavix/aspirin on   ____ Stop Anti-inflammatories on    ____ Stop supplements until after surgery.    ____ Bring C-Pap to the hospital.

## 2016-05-07 ENCOUNTER — Inpatient Hospital Stay: Payer: Medicaid Other | Admitting: Anesthesiology

## 2016-05-07 ENCOUNTER — Encounter: Payer: Self-pay | Admitting: Certified Registered Nurse Anesthetist

## 2016-05-07 ENCOUNTER — Encounter
Admission: RE | Disposition: A | Discharge status: Home / Self Care | Payer: Self-pay | Source: Ambulatory Visit | Attending: Obstetrics and Gynecology

## 2016-05-07 ENCOUNTER — Inpatient Hospital Stay
Admission: RE | Admit: 2016-05-07 | Discharge: 2016-05-09 | DRG: 766 | Disposition: A | Discharge status: Home / Self Care | Payer: Medicaid Other | Source: Ambulatory Visit | Attending: Obstetrics and Gynecology | Admitting: Obstetrics and Gynecology

## 2016-05-07 DIAGNOSIS — Z841 Family history of disorders of kidney and ureter: Secondary | ICD-10-CM | POA: Diagnosis not present

## 2016-05-07 DIAGNOSIS — Z3A39 39 weeks gestation of pregnancy: Secondary | ICD-10-CM

## 2016-05-07 DIAGNOSIS — Z302 Encounter for sterilization: Secondary | ICD-10-CM

## 2016-05-07 DIAGNOSIS — O9902 Anemia complicating childbirth: Secondary | ICD-10-CM | POA: Diagnosis present

## 2016-05-07 DIAGNOSIS — D649 Anemia, unspecified: Secondary | ICD-10-CM | POA: Diagnosis present

## 2016-05-07 DIAGNOSIS — K660 Peritoneal adhesions (postprocedural) (postinfection): Secondary | ICD-10-CM | POA: Diagnosis present

## 2016-05-07 DIAGNOSIS — O34211 Maternal care for low transverse scar from previous cesarean delivery: Principal | ICD-10-CM | POA: Diagnosis present

## 2016-05-07 HISTORY — DX: Personal history of other diseases of the digestive system: Z87.19

## 2016-05-07 LAB — RPR: RPR Ser Ql: NONREACTIVE

## 2016-05-07 SURGERY — Surgical Case
Anesthesia: Spinal | Wound class: Clean Contaminated

## 2016-05-07 MED ORDER — DIBUCAINE 1 % RE OINT: 1.0000 "application " | TOPICAL_OINTMENT | RECTAL | Status: DC | PRN

## 2016-05-07 MED ORDER — OXYTOCIN 40 UNITS IN LACTATED RINGERS INFUSION - SIMPLE MED
2.5000 [IU]/h | INTRAVENOUS | Status: AC
  Filled 2016-05-07: qty 1000

## 2016-05-07 MED ORDER — CEFAZOLIN SODIUM-DEXTROSE 2-4 GM/100ML-% IV SOLN
INTRAVENOUS | Status: AC
  Administered 2016-05-07: 2 g via INTRAVENOUS
  Filled 2016-05-07: qty 100

## 2016-05-07 MED ORDER — OXYCODONE-ACETAMINOPHEN 5-325 MG PO TABS
2.0000 | ORAL_TABLET | ORAL | Status: DC | PRN
  Administered 2016-05-07: 2 via ORAL

## 2016-05-07 MED ORDER — OXYTOCIN 40 UNITS IN LACTATED RINGERS INFUSION - SIMPLE MED
INTRAVENOUS | Status: DC | PRN
  Administered 2016-05-07: 100 mL via INTRAVENOUS
  Administered 2016-05-07: 400 mL via INTRAVENOUS

## 2016-05-07 MED ORDER — FENTANYL CITRATE (PF) 100 MCG/2ML IJ SOLN
INTRAMUSCULAR | Status: DC | PRN
  Administered 2016-05-07: 15 ug via INTRAVENOUS

## 2016-05-07 MED ORDER — IBUPROFEN 600 MG PO TABS
600.0000 mg | ORAL_TABLET | Freq: Four times a day (QID) | ORAL | Status: DC
  Administered 2016-05-08 – 2016-05-09 (×5): 600 mg via ORAL
  Filled 2016-05-07 (×6): qty 1

## 2016-05-07 MED ORDER — ACETAMINOPHEN 325 MG PO TABS: 650.0000 mg | ORAL_TABLET | ORAL | Status: DC | PRN

## 2016-05-07 MED ORDER — BUPIVACAINE HCL (PF) 0.5 % IJ SOLN: 10.0000 mL | Freq: Once | INTRAMUSCULAR | Status: DC

## 2016-05-07 MED ORDER — BUPIVACAINE HCL (PF) 0.5 % IJ SOLN
INTRAMUSCULAR | Status: AC
  Filled 2016-05-07: qty 30

## 2016-05-07 MED ORDER — SOD CITRATE-CITRIC ACID 500-334 MG/5ML PO SOLN
ORAL | Status: AC
  Administered 2016-05-07: 30 mL via ORAL
  Filled 2016-05-07: qty 15

## 2016-05-07 MED ORDER — BUPIVACAINE 0.25 % ON-Q PUMP DUAL CATH 400 ML
400.0000 mL | INJECTION | Status: DC
  Filled 2016-05-07: qty 400

## 2016-05-07 MED ORDER — BUPIVACAINE HCL (PF) 0.5 % IJ SOLN
INTRAMUSCULAR | Status: DC | PRN
  Administered 2016-05-07: 10 mL

## 2016-05-07 MED ORDER — COCONUT OIL OIL: 1.0000 "application " | TOPICAL_OIL | Status: DC | PRN

## 2016-05-07 MED ORDER — OXYCODONE-ACETAMINOPHEN 5-325 MG PO TABS: 1.0000 | ORAL_TABLET | ORAL | Status: DC | PRN

## 2016-05-07 MED ORDER — OXYTOCIN 40 UNITS IN LACTATED RINGERS INFUSION - SIMPLE MED
INTRAVENOUS | Status: AC
  Administered 2016-05-07: 40 [IU]
  Filled 2016-05-07: qty 1000

## 2016-05-07 MED ORDER — KETOROLAC TROMETHAMINE 30 MG/ML IJ SOLN
30.0000 mg | Freq: Four times a day (QID) | INTRAMUSCULAR | Status: AC | PRN
Start: 2016-05-07 — End: 2016-05-08
  Administered 2016-05-07 (×2): 30 mg via INTRAVENOUS
  Filled 2016-05-07 (×2): qty 1

## 2016-05-07 MED ORDER — SENNOSIDES-DOCUSATE SODIUM 8.6-50 MG PO TABS
2.0000 | ORAL_TABLET | ORAL | Status: DC
  Administered 2016-05-08 – 2016-05-09 (×2): 2 via ORAL
  Filled 2016-05-07 (×2): qty 2

## 2016-05-07 MED ORDER — BUPIVACAINE IN DEXTROSE 0.75-8.25 % IT SOLN
INTRATHECAL | Status: DC | PRN
  Administered 2016-05-07: 1.6 mL via INTRATHECAL

## 2016-05-07 MED ORDER — LACTATED RINGERS IV SOLN
INTRAVENOUS | Status: DC
  Administered 2016-05-08: 03:00:00 via INTRAVENOUS

## 2016-05-07 MED ORDER — SOD CITRATE-CITRIC ACID 500-334 MG/5ML PO SOLN
30.0000 mL | ORAL | Status: AC
  Administered 2016-05-07: 30 mL via ORAL

## 2016-05-07 MED ORDER — WITCH HAZEL-GLYCERIN EX PADS: 1.0000 "application " | MEDICATED_PAD | CUTANEOUS | Status: DC | PRN

## 2016-05-07 MED ORDER — PHENYLEPHRINE HCL 10 MG/ML IJ SOLN
INTRAMUSCULAR | Status: DC | PRN
  Administered 2016-05-07: 100 ug via INTRAVENOUS

## 2016-05-07 MED ORDER — OXYCODONE-ACETAMINOPHEN 5-325 MG PO TABS
2.0000 | ORAL_TABLET | ORAL | Status: DC | PRN
  Administered 2016-05-07 – 2016-05-09 (×6): 2 via ORAL
  Filled 2016-05-07 (×6): qty 2

## 2016-05-07 MED ORDER — LACTATED RINGERS IV SOLN
INTRAVENOUS | Status: DC
  Administered 2016-05-07: 08:00:00 via INTRAVENOUS

## 2016-05-07 MED ORDER — OXYCODONE-ACETAMINOPHEN 5-325 MG PO TABS
1.0000 | ORAL_TABLET | ORAL | Status: DC | PRN
  Administered 2016-05-08: 1 via ORAL
  Filled 2016-05-07: qty 1

## 2016-05-07 MED ORDER — SIMETHICONE 80 MG PO CHEW: 80.0000 mg | CHEWABLE_TABLET | ORAL | Status: DC

## 2016-05-07 MED ORDER — SIMETHICONE 80 MG PO CHEW
80.0000 mg | CHEWABLE_TABLET | Freq: Three times a day (TID) | ORAL | Status: DC
  Administered 2016-05-07 – 2016-05-09 (×6): 80 mg via ORAL
  Filled 2016-05-07 (×6): qty 1

## 2016-05-07 MED ORDER — SIMETHICONE 80 MG PO CHEW: 80.0000 mg | CHEWABLE_TABLET | ORAL | Status: DC | PRN

## 2016-05-07 MED ORDER — DIPHENHYDRAMINE HCL 25 MG PO CAPS
25.0000 mg | ORAL_CAPSULE | Freq: Four times a day (QID) | ORAL | Status: DC | PRN
  Administered 2016-05-07 – 2016-05-09 (×2): 25 mg via ORAL
  Filled 2016-05-07 (×2): qty 1

## 2016-05-07 MED ORDER — LACTATED RINGERS IV SOLN: Freq: Once | INTRAVENOUS | Status: DC

## 2016-05-07 MED ORDER — CEFAZOLIN SODIUM-DEXTROSE 2-4 GM/100ML-% IV SOLN
2.0000 g | INTRAVENOUS | Status: AC
  Administered 2016-05-07: 2 g via INTRAVENOUS

## 2016-05-07 MED ORDER — MENTHOL 3 MG MT LOZG: 1.0000 | LOZENGE | OROMUCOSAL | Status: DC | PRN

## 2016-05-07 MED ORDER — OXYCODONE-ACETAMINOPHEN 5-325 MG PO TABS
ORAL_TABLET | ORAL | Status: AC
  Administered 2016-05-07: 2 via ORAL
  Filled 2016-05-07: qty 2

## 2016-05-07 MED ORDER — PRENATAL MULTIVITAMIN CH
1.0000 | ORAL_TABLET | Freq: Every day | ORAL | Status: DC
  Administered 2016-05-07 – 2016-05-09 (×3): 1 via ORAL
  Filled 2016-05-07 (×4): qty 1

## 2016-05-07 SURGICAL SUPPLY — 26 items
BAG COUNTER SPONGE EZ (MISCELLANEOUS) ×2 IMPLANT
CANISTER SUCT 3000ML (MISCELLANEOUS) ×3 IMPLANT
CATH KIT ON-Q SILVERSOAK 5IN (CATHETERS) ×6 IMPLANT
CHLORAPREP W/TINT 26ML (MISCELLANEOUS) ×6 IMPLANT
CLOSURE WOUND 1/2 X4 (GAUZE/BANDAGES/DRESSINGS) ×1
COUNTER SPONGE BAG EZ (MISCELLANEOUS) ×1
DRSG TELFA 3X8 NADH (GAUZE/BANDAGES/DRESSINGS) ×3 IMPLANT
ELECT CAUTERY BLADE 6.4 (BLADE) IMPLANT
ELECT REM PT RETURN 9FT ADLT (ELECTROSURGICAL) ×3
ELECTRODE REM PT RTRN 9FT ADLT (ELECTROSURGICAL) ×1 IMPLANT
GAUZE SPONGE 4X4 12PLY STRL (GAUZE/BANDAGES/DRESSINGS) ×3 IMPLANT
GLOVE BIO SURGEON STRL SZ7 (GLOVE) ×3 IMPLANT
GLOVE INDICATOR 7.5 STRL GRN (GLOVE) ×3 IMPLANT
GOWN STRL REUS W/ TWL LRG LVL3 (GOWN DISPOSABLE) ×3 IMPLANT
GOWN STRL REUS W/TWL LRG LVL3 (GOWN DISPOSABLE) ×6
LIQUID BAND (GAUZE/BANDAGES/DRESSINGS) ×3 IMPLANT
NS IRRIG 1000ML POUR BTL (IV SOLUTION) ×3 IMPLANT
PACK C SECTION AR (MISCELLANEOUS) ×3 IMPLANT
PAD OB MATERNITY 4.3X12.25 (PERSONAL CARE ITEMS) ×3 IMPLANT
PAD PREP 24X41 OB/GYN DISP (PERSONAL CARE ITEMS) ×3 IMPLANT
STRIP CLOSURE SKIN 1/2X4 (GAUZE/BANDAGES/DRESSINGS) ×2 IMPLANT
SUT MNCRL AB 4-0 PS2 18 (SUTURE) ×3 IMPLANT
SUT PDS AB 1 TP1 96 (SUTURE) ×3 IMPLANT
SUT VIC AB 0 CTX 36 (SUTURE) ×2
SUT VIC AB 0 CTX36XBRD ANBCTRL (SUTURE) ×1 IMPLANT
SUT VIC AB 2-0 CT1 36 (SUTURE) ×6 IMPLANT

## 2016-05-07 NOTE — Op Note (Signed)
Preoperative Diagnosis: 1) 25 y.o. [id number] at [redacted]w[redacted]d 2) Desires permanent surgical sterilization 3) History of prior Cesarean section  Postoperative Diagnosis: 1) 25 y.o. [id number] at [redacted]w[redacted]d 2) Desires permanent surgical sterilization 3) History of prior Cesarean section  Operation Performed: Repeat low transverse C-section via pfannenstiel skin incision and Pomeroy tubal ligation  Anesthesia: Spinal  Primary Surgeon: Vena Austria, MD  Assistant: Thomasene Mohair, MD  Preoperative Antibiotics: 2g Ancef  Estimated Blood Loss:  IV Fluids:  Urine Output::  Drains or Tubes: Foley to gravity drainage, ON-Q catheter system  Implants: none  Specimens Removed: Segment of right fallopian tube  Complications: none  Intraoperative Findings:  Normal right tube, right ovary, and uterus.  Surgically absent left tube and ovary.  Moderate adhesion of omentum to anterior abdominal wall and lower uterine segment.  Delivery resulted in the birth of a liveborn female, APGAR (1 MIN): 8, APGAR (5 MINS): 9, weight pending  Patient Condition: stable  Procedure in Detail:  Patient was taken to the operating room were she was administered regional anesthesia.  She was positioned in the supine position, prepped and draped in the  Usual sterile fashion.  Prior to proceeding with the case a time out was performed and the level of anesthetic was checked and noted to be adequate.  Utilizing the scalpel a pfannenstiel skin incision was made 2cm above the pubic symphysis utilizing the patient's pre-existing scar and carried down sharply to the the level of the rectus fascia.  The fascia was incised in the midline using the scalpel and then extended using mayo scissors.  The superior border of the rectus fascia was grasped with two Kocher clamps and the underlying rectus muscles were dissected of the fascia using blunt dissection.  The median raphae was incised using a scalpel, incision of the  superior median raphae was limited cephalad because of omental adhesions.   The inferior border of the rectus fascia was dissected of the rectus muscles in a similar fashion.  The midline was identified, the peritoneum was entered bluntly and expanded using manual tractions.  The uterus was noted to be in a none rotated position.  Next the bladder blade was placed retracting the bladder caudally.  A bladder flap was created while taking down the omental adhesion to the lower uterine segment.  A low transverse incision was scored on the lower uterine segment.  The hysterotomy was entered bluntly using the operators finger.  The hysterotomy incision was extended using manual traction.  The operators hand was placed within the hysterotomy position noting the fetus to be within the OA position.  The vertex was grasped, flexed, brought to the incision, and delivered a traumatically using fundal pressure.  The remainder of the body delivered with ease.  The infant was suctioned, cord was clamped and cut before handing off to the awaiting neonatologist.  The placenta was delivered using manual extraction.  The uterus was exteriorized, wiped clean of clots and debris using two moist laps.  The hysterotomy was closed using a two layer closure of 0 Vicryl, with the first being a running locked, the second a vertical imbricating.  The left tub was grasped in a mid isthmic portion using a Babcock clamp, before being double suture ligated using a 0 chromic wheel.  The intervening nuckel of tube was excised using Metzenbaum scissors.  Complete cross section of tubal ostia visualized, and noted to be hemostatic  The right tube and ovary were noted to be surgically absent.  The uterus was returned to the abdomen.  The peritoneal gutters were wiped clean of clots and debris using two moist laps.  The hysterotomy incision and tubal pedicles were re-inspected noted to be hemostatic.  The rectus muscles were re-approximated in the  midline using a single 2-0 Vicryl mattress stitch.  The rectus muscles were inspected noted to be hemostatic.  The superior border of the rectus fascia was grasped with a Kocher clamp.  The ON-Q trocars were then placed 4cm above the superior border of the incision and tunneled subfascially.  The introducers were removed and the catheters were threaded through the sleeves after which the sleeves were removed.  The fascia was closed using a looped #1 PDS in a running fashion taking 1cm by 1cm bites.  The subcutaneous tissue was irrigated using warm saline, hemostasis achieved using the bovie.  The subcutaneous dead space was less than 3cm and was not closed.  The skin was closed using staples.  Sponge needle and instrument counts were corrects times two.  The patient tolerated the procedure well and was taken to the recovery room in stable condition.

## 2016-05-07 NOTE — Progress Notes (Signed)
Has has developed on her left lateral wrist. Appears to be the connection with her wrist and arm.They insist this an emergent situation and want the doctor notified immediately. Tried to reassure them that I thought this was normal, but they disagree and want the doctor notified now. I have paged Dr Bonney AidStaebler

## 2016-05-07 NOTE — H&P (Signed)
Date of Initial paper H&P: 05/06/2016  History reviewed, patient examined, no change in status, stable for surgery.

## 2016-05-07 NOTE — Anesthesia Procedure Notes (Signed)
Spinal  Patient location during procedure: OR Start time: 05/07/2016 8:48 AM End time: 05/07/2016 8:51 AM Staffing Anesthesiologist: Gunnar Fusi Resident/CRNA: Johnna Acosta Performed: resident/CRNA  Preanesthetic Checklist Completed: patient identified, site marked, surgical consent, pre-op evaluation, timeout performed, IV checked, risks and benefits discussed and monitors and equipment checked Spinal Block Patient position: sitting Prep: ChloraPrep Patient monitoring: heart rate, continuous pulse ox, blood pressure and cardiac monitor Approach: midline Location: L4-5 Injection technique: single-shot Needle Needle type: Whitacre and Introducer  Needle gauge: 24 G Needle length: 9 cm Assessment Sensory level: T10 Additional Notes Negative paresthesia. Negative blood return. Positive free-flowing CSF. Expiration date of kit checked and confirmed. Patient tolerated procedure well, without complications.

## 2016-05-07 NOTE — Anesthesia Preprocedure Evaluation (Signed)
Anesthesia Evaluation  Patient identified by MRN, date of birth, ID band Patient awake    Reviewed: Allergy & Precautions, NPO status , Patient's Chart, lab work & pertinent test results  History of Anesthesia Complications Negative for: history of anesthetic complications  Airway Mallampati: II       Dental   Pulmonary neg pulmonary ROS,           Cardiovascular negative cardio ROS       Neuro/Psych Anxiety negative neurological ROS     GI/Hepatic negative GI ROS, Neg liver ROS,   Endo/Other  negative endocrine ROS  Renal/GU negative Renal ROS     Musculoskeletal   Abdominal   Peds  Hematology negative hematology ROS (+)   Anesthesia Other Findings   Reproductive/Obstetrics (+) Pregnancy                             Anesthesia Physical Anesthesia Plan  ASA: II  Anesthesia Plan: Spinal   Post-op Pain Management:    Induction:   Airway Management Planned:   Additional Equipment:   Intra-op Plan:   Post-operative Plan:   Informed Consent: I have reviewed the patients History and Physical, chart, labs and discussed the procedure including the risks, benefits and alternatives for the proposed anesthesia with the patient or authorized representative who has indicated his/her understanding and acceptance.     Plan Discussed with:   Anesthesia Plan Comments:         Anesthesia Quick Evaluation

## 2016-05-07 NOTE — Transfer of Care (Signed)
Immediate Anesthesia Transfer of Care Note  Patient: Tracy Glover  Procedure(s) Performed: Procedure(s): CESAREAN SECTION WITH RIGHT TUBAL LIGATION (N/A)  Patient Location: PACU  Anesthesia Type:Spinal  Level of Consciousness: awake, alert  and oriented  Airway & Oxygen Therapy: Patient Spontanous Breathing  Post-op Assessment: Report given to RN and Post -op Vital signs reviewed and stable  Post vital signs: Reviewed and stable  Last Vitals:  Vitals:   05/07/16 0954  BP: 104/64  Pulse: 63  Resp: 16  Temp: 36.4 C    Last Pain:  Vitals:   05/07/16 0954  TempSrc: Oral         Complications: No apparent anesthesia complications

## 2016-05-08 ENCOUNTER — Encounter: Payer: Self-pay | Admitting: Obstetrics and Gynecology

## 2016-05-08 LAB — CBC
HEMATOCRIT: 31 % — AB (ref 35.0–47.0)
HEMOGLOBIN: 10.7 g/dL — AB (ref 12.0–16.0)
MCH: 30.2 pg (ref 26.0–34.0)
MCHC: 34.6 g/dL (ref 32.0–36.0)
MCV: 87.4 fL (ref 80.0–100.0)
Platelets: 203 10*3/uL (ref 150–440)
RBC: 3.55 MIL/uL — ABNORMAL LOW (ref 3.80–5.20)
RDW: 13.1 % (ref 11.5–14.5)
WBC: 8.6 10*3/uL (ref 3.6–11.0)

## 2016-05-08 LAB — SURGICAL PATHOLOGY

## 2016-05-08 NOTE — Progress Notes (Signed)
  Postpartum Day 1  Subjective: Pt resting during exam. Denies concerns.   Objective: Blood pressure (!) 108/52, pulse (!) 55, temperature 98.2 F (36.8 C), temperature source Oral, resp. rate 18, last menstrual period 08/02/2015, SpO2 98 %  Physical Exam:  General: cooperative Lochia: appropriate Uterine Fundus: firm Incision: paper dressing, no bleeding noted through dressing, On-Q intact DVT Evaluation: No evidence of DVT seen on physical exam. Abdomen: soft, NT   Recent Labs  05/06/16 1331 05/08/16 0607  HGB 12.2 10.7*  HCT 35.4 31.0*    Assessment POD #1, repeat LTCS with tubal ligation  Plan: Continue PO care and Advance activity as tolerated  RN to remove dressing after shower  Feeding: bottle Contraception: s/p tubal ligation Blood Type: O+ RI/VI TDAP UTD    Marta AntuBrothers, Keasha Malkiewicz, [place] 05/08/2016, 9:50 AM

## 2016-05-08 NOTE — Anesthesia Post-op Follow-up Note (Signed)
  Anesthesia Pain Follow-up Note  Patient: Tracy Glover  Day #: 1  Date of Follow-up: 05/08/2016 Time: 7:37 AM  Last Vitals:  Vitals:   05/07/16 2300 05/08/16 0328  BP: 111/62 (!) 94/58  Pulse: (!) 59 70  Resp: 18 20  Temp: 37.2 C 37.2 C    Level of Consciousness: alert  Pain: 6 /10   Side Effects:None  Catheter Site Exam:clean, dry, no drainage     Plan: D/C from anesthesia care  Starling Mannsurtis,  Bode Pieper A

## 2016-05-08 NOTE — Anesthesia Postprocedure Evaluation (Signed)
Anesthesia Post Note  Patient: Suriah N ThSharen Hintompson  Procedure(s) Performed: Procedure(s) (LRB): CESAREAN SECTION WITH RIGHT TUBAL LIGATION (N/A)  Patient location during evaluation: Mother Baby Anesthesia Type: Spinal Level of consciousness: awake and alert Pain management: pain level controlled Vital Signs Assessment: post-procedure vital signs reviewed and stable Respiratory status: spontaneous breathing Cardiovascular status: blood pressure returned to baseline Postop Assessment: patient able to bend at knees and no signs of nausea or vomiting Anesthetic complications: no    Last Vitals:  Vitals:   05/07/16 2300 05/08/16 0328  BP: 111/62 (!) 94/58  Pulse: (!) 59 70  Resp: 18 20  Temp: 37.2 C 37.2 C    Last Pain:  Vitals:   05/08/16 0615  TempSrc:   PainSc: 7                  Starling Mannsurtis,  Trana Ressler A

## 2016-05-09 ENCOUNTER — Encounter: Payer: Self-pay | Admitting: Student

## 2016-05-09 MED ORDER — IBUPROFEN 600 MG PO TABS: 600.0000 mg | ORAL_TABLET | Freq: Four times a day (QID) | ORAL | 0 refills | Status: DC | PRN

## 2016-05-09 MED ORDER — OXYCODONE-ACETAMINOPHEN 5-325 MG PO TABS: 1.0000 | ORAL_TABLET | Freq: Four times a day (QID) | ORAL | 0 refills | Status: DC | PRN

## 2016-05-09 NOTE — Discharge Instructions (Signed)
Please call your doctor or return to the ER if you experience any chest pains, shortness of breath, fever greater than 101, any heavy bleeding or large clots, and foul smelling vaginal discharge, any worsening abdominal pain & cramping that is not controlled by pain medication, or any signs of post partum depression.  Please check your incision daily for redness, swelling, drainage and keep it clean and dry. No tampons, enemas, douches, or sexual intercourse for 6 weeks.  Also avoid tub baths, hot tubs, or swimming for 6 weeks.    Cesarean Delivery, Care After Refer to this sheet in the next few weeks. These instructions provide you with information on caring for yourself after your procedure. Your health care provider may also give you specific instructions. Your treatment has been planned according to current medical practices, but problems sometimes occur. Call your health care provider if you have any problems or questions after you go home. HOME CARE INSTRUCTIONS  If you have an On-Q pump, remove it on the 4th or 5th day after your surgery, by removing the dressing/bandage and pulling the pump out. Cover the site where the pump strings came out with a band-aid, as needed.  Only take over-the-counter or prescription medications as directed by your health care provider.  Do not drink alcohol, especially if you are breastfeeding or taking medication to relieve pain.  Do not  smoke tobacco.  Continue to use good perineal care. Good perineal care includes:  Wiping your perineum from front to back.  Keeping your perineum clean.  Check your surgical cut (incision) daily for increased redness, drainage, swelling, or separation of skin.  Shower and clean your incision gently with soap and water every day, by letting warm and soapy water run over the incision, and then pat it dry. If your health care provider says it is okay, leave the incision uncovered. Use a bandage (dressing) if the incision  is draining fluid or appears irritated. If the adhesive strips across the incision do not fall off within 7 days, carefully peel them off, after a shower.  Hug a pillow when coughing or sneezing until your incision is healed. This helps to relieve pain.  Do not use tampons, douches or have sexual intercourse for 6 weeks  Wear a well-fitting bra that provides breast support.  Limit wearing support panties or control-top hose.  Drink enough fluids to keep your urine clear or pale yellow.  Eat high-fiber foods such as whole grain cereals and breads, brown rice, beans, and fresh fruits and vegetables every day. These foods may help prevent or relieve constipation.  Resume activities such as climbing stairs, driving, lifting, exercising, or traveling as directed by your health care provider.  Try to have someone help you with your household activities and your newborn for at least a few days after you leave the hospital.  Rest as much as possible. Try to rest or take a nap when your newborn is sleeping.  Increase your activities gradually.  Do not lift more than 15lbs until directed by a provider.  Keep all of your scheduled postpartum appointments. It is very important to keep your scheduled follow-up appointments. At these appointments, your health care provider will be checking to make sure that you are healing physically and emotionally. SEEK MEDICAL CARE IF: \ You have a temperature of 100.5 or more  You are passing large clots from your vagina. Save any clots to show your health care provider.  You have a foul smelling discharge  from your vagina.  You have trouble urinating.  You are urinating frequently.  You have pain when you urinate.  You have a change in your bowel movements.  You have increasing redness, pain, or swelling near your incision.  You have pus draining from your incision.  Your incision is separating.  You have painful, hard, or reddened  breasts.  You have a severe headache.  You have blurred vision or see spots.  You feel sad or depressed.  You have thoughts of hurting yourself or your newborn.  You have questions about your care, the care of your newborn, or medications.  You are dizzy or light-headed.  You have a rash.  You have pain, redness, or swelling at the site of the removed intravenous access (IV) tube.  You have nausea or vomiting.  You stopped breastfeeding and have not had a menstrual period within 12 weeks of stopping.  You are not breastfeeding and have not had a menstrual period within 12 weeks of delivery.  You have a fever. SEEK IMMEDIATE MEDICAL CARE IF:  You have persistent pain.  You have chest pain.  You have shortness of breath.  You faint.  You have leg pain.  You have stomach pain.  Your vaginal bleeding saturates 3 or more sanitary pads in 1 hour. MAKE SURE YOU:   Understand these instructions.  Will watch your condition.  Will get help right away if you are not doing well or get worse. Document Released: 05/18/2002 Document Revised: 01/10/2014 Document Reviewed: 04/22/2012 [facility] Patient Information 2015 [place] [place]. This information is not intended to replace advice given to you by your health care provider. Make sure you discuss any questions you have with your health care provider.

## 2016-05-09 NOTE — Progress Notes (Signed)
D/C order from MD.  Reviewed d/c instructions and prescriptions with patient and answered any questions.  Patient d/c home with infant via wheelchair by nursing/auxillary. 

## 2016-05-09 NOTE — Clinical Social Work Maternal (Signed)
  CLINICAL SOCIAL WORK MATERNAL/CHILD NOTE  Patient Details  Name: Tracy Glover MRN: [id number] Date of Birth: Oct 13, 1990  Date:  05/09/2016  Clinical Social Worker Initiating Note:   Tracy Glover, Tracy Glover) Date/ Time Initiated:  05/09/16/1103     Child's Name:      Legal Guardian:  Mother   Need for Interpreter:  None   Date of Referral:  05/09/16     Reason for Referral:  Other (Comment) (Provide Resources )   Referral Source:  RN   Address:   ([place] [address])  Phone number:   [phone])   Household Members:  Self, Minor Children, Significant Other   Natural Supports (not living in the home):  Parent   Professional Supports: None   Employment: Part-time   Type of Work:     Education:  Diplomatic Services operational officer Resources:  Medicaid   Other Resources:  [facility]   Cultural/Religious Considerations Which May Impact Care:  N/A   Strengths:  Ability to meet basic needs , Compliance with medical plan , Home prepared for child    Risk Factors/Current Problems:  None   Cognitive State:  Able to Concentrate , Alert , Linear Thinking    Mood/Affect:  Calm    CSW Assessment: Clinical Education officer, museum (CSW) received consult for "[name] just had third CS. Please see if she needs any help with resources to care for new baby." CSW met with patient and father of the baby and infant were in the room. CSW introduced self and explained role of CSW department. Mother reported that this is her third child and she has a 7 and 41 year old. Per mother the children live with her and her boyfriend in [place]. Per boyfriend he is the father of the 56 year old and infant. Per mother they have all the baby supplies needed for infant including a car seat. Per mom they have reliable transportation and have a ride home today. Mother reported no needs or concerns at this time. CSW provided mother and father with information about how to  obtain baby supplies in the community if needed. RN aware of above. Please reconsult if future social work needs arise. CSW signing off.   CSW Plan/Description:  No Further Intervention Required/No Barriers to Discharge    Tracy Glover, Tracy Beets, LCSW 05/09/2016, 11:05 AM

## 2016-05-09 NOTE — Progress Notes (Signed)
Pt discharged to home with newborn in car seat.  To car via auxillary in wheelchair.

## 2016-05-09 NOTE — Discharge Summary (Signed)
Physician Obstetric Discharge Summary  Patient ID: Tracy Glover Byron MRN: [id number] DOB/AGE: February 21, 1991 25 y.o.   Date of Admission: 05/07/2016  Date of Discharge: 05/09/2016  Admitting Diagnosis: Scheduled cesarean section at 6532w1d  Secondary Diagnosis: Previous Cesarean section x 2, desires sterilization  Mode of Delivery: repeat cesarean section and right tubal ligation (left tube surgically removed) low uterine, transverse     Discharge Diagnosis: Term intrauterine pregnancy-delivered, chronic anemia   Intrapartum Procedures: see above   Post partum procedures: none  Complications: none   Brief Hospital Course    (Cesarean Section): Tracy Glover Venard is a [id number] who underwent low transverse cesarean section and right tubal ligation on 05/07/2016.  Patient had an uncomplicated surgery; for further details of this surgery, please refer to the operative note.  Patient had an uncomplicated postpartum course.  By time of discharge on POD#2, her pain was controlled on oral pain medications; she had appropriate lochia and was ambulating, voiding without difficulty, tolerating regular diet and passing flatus.   She was deemed stable for discharge to home.    Labs: CBC Latest Ref Rng & Units 05/08/2016 05/06/2016 04/29/2016  WBC 3.6 - 11.0 K/uL 8.6 7.5 9.9  Hemoglobin 12.0 - 16.0 g/dL 10.7(L) 12.2 11.8(L)  Hematocrit 35.0 - 47.0 % 31.0(L) 35.4 34.4(L)  Platelets 150 - 440 K/uL 203 240 241   O POS/ RI/ VI/ TDAP UTD  Physical exam:  Blood pressure 112/61, pulse 61, temperature 98.1 F (36.7 C), temperature source Oral, resp. rate 18, last menstrual period 08/02/2015, SpO2 100 %, unknown if currently breastfeeding. General: alert and no distress Lung: CTAB Heart: RRR without murmur Lochia: appropriate Abdomen: soft, NT, not distended, BS active Uterine Fundus: firm at U-1/ML/NT Incision: healing well, no significant drainage, no dehiscence, no significant erythema. Staples  intact. On Q intact Extremities: No evidence of DVT seen on physical exam. No lower extremity edema.  Discharge Instructions: Per After Visit Summary. Activity: Advance as tolerated. No tampons, douching or intercourse  for 6 weeks.  No heavy lifting x 4-6 weeks. No driving for at least 2 weeks or if taking narcotic pain medications. Also refer to Discharge Instructions Diet: Regular Medications:   Medication List    TAKE these medications   ibuprofen 600 MG tablet Commonly known as:  ADVIL,MOTRIN Take 1 tablet (600 mg total) by mouth every 6 (six) hours as needed.   oxyCODONE-acetaminophen 5-325 MG tablet Commonly known as:  PERCOCET/ROXICET Take 1-2 tablets by mouth every 6 (six) hours as needed for moderate pain or severe pain (pain scale 4-7).   Prenatal Vitamin 27-0.8 MG Tabs Take 1 tablet by mouth daily.      Outpatient follow up:  Postpartum contraception: tubal  Discharged Condition: stable  Discharged to: home   Newborn Data: Disposition:home with mother/ Javion/ 6#10.5oz  Apgars: APGAR (1 MIN): 8   APGAR (5 MINS): 9   APGAR (10 MINS):    Baby Feeding: Bottle  Martika Egler, CNM 05/09/2016 10:35 AM

## 2016-09-23 ENCOUNTER — Emergency Department
Admission: EM | Admit: 2016-09-23 | Discharge: 2016-09-23 | Disposition: A | Discharge status: Home / Self Care | Payer: Medicaid Other | Attending: Emergency Medicine | Admitting: Emergency Medicine

## 2016-09-23 ENCOUNTER — Encounter: Payer: Self-pay | Admitting: Emergency Medicine

## 2016-09-23 DIAGNOSIS — R350 Frequency of micturition: Secondary | ICD-10-CM | POA: Diagnosis present

## 2016-09-23 DIAGNOSIS — N12 Tubulo-interstitial nephritis, not specified as acute or chronic: Secondary | ICD-10-CM | POA: Diagnosis not present

## 2016-09-23 DIAGNOSIS — Z79899 Other long term (current) drug therapy: Secondary | ICD-10-CM | POA: Insufficient documentation

## 2016-09-23 LAB — URINALYSIS, COMPLETE (UACMP) WITH MICROSCOPIC
BILIRUBIN URINE: NEGATIVE
Glucose, UA: NEGATIVE mg/dL
HGB URINE DIPSTICK: NEGATIVE
KETONES UR: NEGATIVE mg/dL
Nitrite: POSITIVE — AB
Protein, ur: 30 mg/dL — AB
SPECIFIC GRAVITY, URINE: 1.016 (ref 1.005–1.030)
pH: 6 (ref 5.0–8.0)

## 2016-09-23 LAB — BASIC METABOLIC PANEL
Anion gap: 7 (ref 5–15)
BUN: 13 mg/dL (ref 6–20)
CALCIUM: 8.9 mg/dL (ref 8.9–10.3)
CHLORIDE: 108 mmol/L (ref 101–111)
CO2: 25 mmol/L (ref 22–32)
CREATININE: 0.7 mg/dL (ref 0.44–1.00)
GFR calc Af Amer: >60 mL/min (ref >60)
GFR calc non Af Amer: >60 mL/min (ref >60)
Glucose, Bld: 94 mg/dL (ref 65–99)
Potassium: 3.8 mmol/L (ref 3.5–5.1)
SODIUM: 140 mmol/L (ref 135–145)

## 2016-09-23 LAB — CBC
HCT: 38.4 % (ref 35.0–47.0)
Hemoglobin: 12.6 g/dL (ref 12.0–16.0)
MCH: 28.2 pg (ref 26.0–34.0)
MCHC: 32.9 g/dL (ref 32.0–36.0)
MCV: 85.6 fL (ref 80.0–100.0)
PLATELETS: 282 10*3/uL (ref 150–440)
RBC: 4.48 MIL/uL (ref 3.80–5.20)
RDW: 14.1 % (ref 11.5–14.5)
WBC: 11.2 10*3/uL — AB (ref 3.6–11.0)

## 2016-09-23 MED ORDER — CEFTRIAXONE SODIUM 1 G IJ SOLR
1.0000 g | Freq: Once | INTRAMUSCULAR | Status: AC
  Administered 2016-09-23: 1 g via INTRAMUSCULAR
  Filled 2016-09-23: qty 10

## 2016-09-23 MED ORDER — IBUPROFEN 600 MG PO TABS: 600.0000 mg | ORAL_TABLET | Freq: Three times a day (TID) | ORAL | 0 refills | Status: DC | PRN

## 2016-09-23 MED ORDER — CEPHALEXIN 500 MG PO CAPS: 500.0000 mg | ORAL_CAPSULE | Freq: Three times a day (TID) | ORAL | 0 refills | Status: DC

## 2016-09-23 MED ORDER — LIDOCAINE HCL (PF) 1 % IJ SOLN
INTRAMUSCULAR | Status: AC
  Filled 2016-09-23: qty 5

## 2016-09-23 MED ORDER — IBUPROFEN 600 MG PO TABS
600.0000 mg | ORAL_TABLET | Freq: Once | ORAL | Status: AC
  Administered 2016-09-23: 600 mg via ORAL
  Filled 2016-09-23: qty 1

## 2016-09-23 NOTE — ED Provider Notes (Signed)
[facility] Emergency Department Provider Note ____________________________________________   I have reviewed the triage vital signs and the triage nursing note.  HISTORY  Chief Complaint Back Pain and Urinary Frequency   Historian Patient  HPI Tracy Glover is a 26 y.o. female presents with maybe a day or so of low back pain and suprapubic discomfort with some frequency of urination. No hematuria. No fever. No nausea or vomiting. Pain is moderate. Nothing seems to make it worse or better. She's not sure she's had a urinary tract infection before.  No vaginal pain. No vaginal discharge. No abnormal vaginal bleeding.  No recent URI symptoms.    Past Medical History:  Diagnosis Date  . Cholecystitis   . Hx of gallstones 2016  Reports to me that she's had her appendix out  Patient Active Problem List   Diagnosis Date Noted  . Postpartum care following cesarean delivery 03/24/2016    Past Surgical History:  Procedure Laterality Date  . CESAREAN SECTION    . CESAREAN SECTION WITH BILATERAL TUBAL LIGATION N/A 05/07/2016   Procedure: CESAREAN SECTION WITH RIGHT TUBAL LIGATION;  Surgeon: Vena Austria, MD;  Location: [facility] ORS;  Service: Obstetrics;  Laterality: N/A;  . CHOLECYSTECTOMY N/A 05/19/2015   Procedure: LAPAROSCOPIC CHOLECYSTECTOMY;  Surgeon: Natale Lay, MD;  Location: [facility] ORS;  Service: General;  Laterality: N/A;    Prior to Admission medications   Medication Sig Start Date End Date Taking? Authorizing Provider  cephALEXin (KEFLEX) 500 MG capsule Take 1 capsule (500 mg total) by mouth 3 (three) times daily. 09/23/16   Governor Rooks, MD  ibuprofen (ADVIL,MOTRIN) 600 MG tablet Take 1 tablet (600 mg total) by mouth every 8 (eight) hours as needed. 09/23/16   Governor Rooks, MD  oxyCODONE-acetaminophen (PERCOCET/ROXICET) 5-325 MG tablet Take 1-2 tablets by mouth every 6 (six) hours as needed for moderate pain or severe pain (pain scale 4-7).  05/09/16   Farrel Conners, CNM  Prenatal Vit-Fe Fumarate-FA (PRENATAL VITAMIN) 27-0.8 MG TABS Take 1 tablet by mouth daily. Patient not taking: Reported on 05/07/2016 04/29/16   Farrel Conners, CNM    No Known Allergies  Family History  Problem Relation Age of Onset  . Cancer Mother   . Kidney disease Father     Currently on Dialysis    Social History Social History  Substance Use Topics  . Smoking status: Never Smoker  . Smokeless tobacco: Never Used  . Alcohol use No    Review of Systems  Constitutional: Negative for fever. Eyes: Negative for visual changes. ENT: Negative for sore throat. Cardiovascular: Negative for chest pain. Respiratory: Negative for shortness of breath. Gastrointestinal: Negative for  vomiting and diarrhea. Genitourinary: Mild dysuria. Musculoskeletal: Pain in the lower bac Skin: Negative for rash. Neurological: Negative for headache. 10 point Review of Systems otherwise negative ____________________________________________   PHYSICAL EXAM:  VITAL SIGNS: ED Triage Vitals  Enc Vitals Group     BP 09/23/16 0458 125/75     Pulse Rate 09/23/16 0458 76     Resp 09/23/16 0458 16     Temp 09/23/16 0458 97.9 F (36.6 C)     Temp Source 09/23/16 0458 Oral     SpO2 09/23/16 0458 95 %     Weight 09/23/16 0446 170 lb (77.1 kg)     Height 09/23/16 0446 5\' 6"  (1.676 m)     Head Circumference --      Peak Flow --      Pain Score 09/23/16 0446 10  Pain Loc --      Pain Edu? --      Excl. in GC? --      Constitutional: Alert and oriented. Well appearing and in no distress. HEENT   Head: Normocephalic and atraumatic.      Eyes: Conjunctivae are normal. PERRL. Normal extraocular movements.      Ears:         Nose: No congestion/rhinnorhea.   Mouth/Throat: Mucous membranes are moist.   Neck: No stridor. Cardiovascular/Chest: Normal rate, regular rhythm.  No murmurs, rubs, or gallops. Respiratory: Normal respiratory effort  without tachypnea nor retractions. Breath sounds are clear and equal bilaterally. No wheezes/rales/rhonchi. Gastrointestinal: Soft. No distention, no guarding, no rebound. Mild tenderness in the suprapubic area. No focal right lower quadrant left lower quadrant tenderness. No upper abdominal tenderness. Genitourinary/rectal:Deferred Musculoskeletal: Nontender with normal range of motion in all extremities. No joint effusions.  No lower extremity tenderness.  No edema. Neurologic:  Normal speech and language. No gross or focal neurologic deficits are appreciated. Skin:  Skin is warm, dry and intact. No rash noted. Psychiatric: Mood and affect are normal. Speech and behavior are normal. Patient exhibits appropriate insight and judgment.   ____________________________________________  LABS (pertinent positives/negatives)  Labs Reviewed  URINALYSIS, COMPLETE (UACMP) WITH MICROSCOPIC - Abnormal; Notable for the following:       Result Value   Color, Urine YELLOW (*)    APPearance CLEAR (*)    Protein, ur 30 (*)    Nitrite POSITIVE (*)    Leukocytes, UA SMALL (*)    Bacteria, UA MANY (*)    Squamous Epithelial / LPF 0-5 (*)    All other components within normal limits  CBC - Abnormal; Notable for the following:    WBC 11.2 (*)    All other components within normal limits  URINE CULTURE  BASIC METABOLIC PANEL  POC URINE PREG, ED    ____________________________________________    EKG I, Governor Rooks, MD, the attending physician have personally viewed and interpreted all ECGs.  None ____________________________________________  RADIOLOGY All Xrays were viewed by me. Imaging interpreted by Radiologist.  None  __________________________________________  PROCEDURES  Procedure(s) performed: None  Critical Care performed: None  ____________________________________________   ED COURSE / ASSESSMENT AND PLAN  Pertinent labs & imaging results that were available during my  care of the patient were reviewed by me and considered in my medical decision making (see chart for details).   Ms. Sayed has symptoms of low back soreness and suprapubic discomfort, with nitrite positive UTI. Urine pregnancy test is negative.  I discussed with her treatment for pyelonephritis given the location of pain into the back. She is overall well appearing without signs of sepsis. I will send a culture. I'm going to give her first dose Rocephin as an injection, start Keflex tomorrow.  I'm not suspicious of additional GYN emergency, no vaginal discharge or complaints, and therefore chose to defer pelvic exam at this point in time with alternate cause for her symptoms.    CONSULTATIONS: None  Patient / Family / Caregiver informed of clinical course, medical decision-making process, and agree with plan.   I discussed return precautions, follow-up instructions, and discharge instructions with patient and/or family.   ___________________________________________   FINAL CLINICAL IMPRESSION(S) / ED DIAGNOSES   Final diagnoses:  Pyelonephritis              Note: This dictation was prepared with Dragon dictation. Any transcriptional errors that result from this process are  unintentional    Governor Rooksebecca Chukwuemeka Artola, MD 09/23/16 [id number]

## 2016-09-23 NOTE — ED Triage Notes (Signed)
Pt ambulatory to triage with no difficulty. Pt reports pain to her bilateral lower back region. Pt also reports painful urination and frequency with foul smelling urine. Pt denies injury to her back.

## 2016-09-23 NOTE — ED Notes (Signed)
Dr. Lord in room to assess patient.  Will continue to monitor.   

## 2016-09-23 NOTE — ED Notes (Addendum)
POCT Results were NEGATIVE  

## 2016-09-23 NOTE — Discharge Instructions (Signed)
You were given 24-hour dose of Rocephin shot here in the emergency department for kidney infection also called pyelonephritis.   Start your antibiotic tomorrow morning.  Return to the emergency department for any worsening pain, fever, vomiting and cannot keep her medications down, dizziness or passing out, confusion or altered mental status, or any vaginal pain or vaginal discharge.

## 2016-09-25 LAB — URINE CULTURE: Culture: >=100000 — AB

## 2017-05-04 ENCOUNTER — Emergency Department
Admission: EM | Admit: 2017-05-04 | Discharge: 2017-05-04 | Disposition: A | Discharge status: Home / Self Care | Payer: Medicaid Other | Attending: Emergency Medicine | Admitting: Emergency Medicine

## 2017-05-04 ENCOUNTER — Emergency Department: Payer: Medicaid Other

## 2017-05-04 DIAGNOSIS — Z79899 Other long term (current) drug therapy: Secondary | ICD-10-CM | POA: Insufficient documentation

## 2017-05-04 DIAGNOSIS — N730 Acute parametritis and pelvic cellulitis: Secondary | ICD-10-CM | POA: Diagnosis not present

## 2017-05-04 DIAGNOSIS — M25552 Pain in left hip: Secondary | ICD-10-CM

## 2017-05-04 DIAGNOSIS — R1032 Left lower quadrant pain: Secondary | ICD-10-CM | POA: Diagnosis present

## 2017-05-04 LAB — URINALYSIS, COMPLETE (UACMP) WITH MICROSCOPIC
BILIRUBIN URINE: NEGATIVE
GLUCOSE, UA: NEGATIVE mg/dL
HGB URINE DIPSTICK: NEGATIVE
KETONES UR: NEGATIVE mg/dL
LEUKOCYTES UA: NEGATIVE
NITRITE: NEGATIVE
PH: 6 (ref 5.0–8.0)
Protein, ur: NEGATIVE mg/dL
SPECIFIC GRAVITY, URINE: 1.025 (ref 1.005–1.030)

## 2017-05-04 LAB — COMPREHENSIVE METABOLIC PANEL
ALT: 15 U/L (ref 14–54)
AST: 26 U/L (ref 15–41)
Albumin: 3.9 g/dL (ref 3.5–5.0)
Alkaline Phosphatase: 90 U/L (ref 38–126)
Anion gap: 7 (ref 5–15)
BILIRUBIN TOTAL: 0.4 mg/dL (ref 0.3–1.2)
BUN: 20 mg/dL (ref 6–20)
CHLORIDE: 105 mmol/L (ref 101–111)
CO2: 26 mmol/L (ref 22–32)
CREATININE: 0.9 mg/dL (ref 0.44–1.00)
Calcium: 9.1 mg/dL (ref 8.9–10.3)
Glucose, Bld: 81 mg/dL (ref 65–99)
POTASSIUM: 4 mmol/L (ref 3.5–5.1)
Sodium: 138 mmol/L (ref 135–145)
TOTAL PROTEIN: 7.1 g/dL (ref 6.5–8.1)

## 2017-05-04 LAB — CBC
HCT: 40.9 % (ref 35.0–47.0)
Hemoglobin: 13.6 g/dL (ref 12.0–16.0)
MCH: 29.5 pg (ref 26.0–34.0)
MCHC: 33.3 g/dL (ref 32.0–36.0)
MCV: 88.7 fL (ref 80.0–100.0)
PLATELETS: 356 10*3/uL (ref 150–440)
RBC: 4.61 MIL/uL (ref 3.80–5.20)
RDW: 13.4 % (ref 11.5–14.5)
WBC: 9.7 10*3/uL (ref 3.6–11.0)

## 2017-05-04 LAB — WET PREP, GENITAL
Clue Cells Wet Prep HPF POC: NONE SEEN
SPERM: NONE SEEN
TRICH WET PREP: NONE SEEN
WBC, Wet Prep HPF POC: NONE SEEN
Yeast Wet Prep HPF POC: NONE SEEN

## 2017-05-04 LAB — CHLAMYDIA/NGC RT PCR (ARMC ONLY)
CHLAMYDIA TR: NOT DETECTED
N gonorrhoeae: NOT DETECTED

## 2017-05-04 LAB — POCT PREGNANCY, URINE: Preg Test, Ur: NEGATIVE

## 2017-05-04 MED ORDER — IOPAMIDOL (ISOVUE-300) INJECTION 61%
100.0000 mL | Freq: Once | INTRAVENOUS | Status: AC | PRN
  Administered 2017-05-04: 100 mL via INTRAVENOUS

## 2017-05-04 MED ORDER — MORPHINE SULFATE (PF) 4 MG/ML IV SOLN
4.0000 mg | Freq: Once | INTRAVENOUS | Status: AC
  Administered 2017-05-04: 4 mg via INTRAVENOUS

## 2017-05-04 MED ORDER — MORPHINE SULFATE (PF) 4 MG/ML IV SOLN
INTRAVENOUS | Status: AC
  Filled 2017-05-04: qty 1

## 2017-05-04 MED ORDER — DOXYCYCLINE HYCLATE 50 MG PO CAPS: 100.0000 mg | ORAL_CAPSULE | Freq: Two times a day (BID) | ORAL | 0 refills | Status: AC

## 2017-05-04 MED ORDER — ONDANSETRON HCL 4 MG/2ML IJ SOLN
4.0000 mg | Freq: Once | INTRAMUSCULAR | Status: AC
  Administered 2017-05-04: 4 mg via INTRAVENOUS
  Filled 2017-05-04: qty 2

## 2017-05-04 MED ORDER — AZITHROMYCIN 1 G PO PACK
1.0000 g | PACK | Freq: Once | ORAL | Status: AC
  Administered 2017-05-04: 1 g via ORAL
  Filled 2017-05-04: qty 1

## 2017-05-04 MED ORDER — CEFTRIAXONE SODIUM 250 MG IJ SOLR
250.0000 mg | Freq: Once | INTRAMUSCULAR | Status: AC
  Administered 2017-05-04: 250 mg via INTRAMUSCULAR
  Filled 2017-05-04: qty 250

## 2017-05-04 MED ORDER — AZITHROMYCIN 500 MG PO TABS: 1000.0000 mg | ORAL_TABLET | Freq: Once | ORAL | Status: DC

## 2017-05-04 MED ORDER — OXYCODONE-ACETAMINOPHEN 5-325 MG PO TABS: 1.0000 | ORAL_TABLET | ORAL | 0 refills | Status: DC | PRN

## 2017-05-04 MED ORDER — MORPHINE SULFATE (PF) 2 MG/ML IV SOLN
2.0000 mg | Freq: Once | INTRAVENOUS | Status: AC
  Administered 2017-05-04: 2 mg via INTRAVENOUS
  Filled 2017-05-04: qty 1

## 2017-05-04 NOTE — ED Notes (Signed)
Patient transported to ultrasound.

## 2017-05-04 NOTE — ED Triage Notes (Signed)
Pt states llq pain for a week that has gotten worse since last pm. Pt states one episode of emesis this am.

## 2017-05-04 NOTE — ED Provider Notes (Signed)
[facility] Emergency Department Provider Note   First MD Initiated Contact with Patient 05/04/17 0407     (approximate)  I have reviewed the triage vital signs and the nursing notes.   HISTORY  Chief Complaint Abdominal Pain   HPI Tracy Glover is a 26 y.o. female presents with one-week history of left lower quadrant/pelvic abdominal pain is currently 10 out of 10. Patient admits to one episode of vomiting tonight stating that her pain acutely worsened last night as well. Patient denies any fever afebrile on presentation. Patient denies any diarrhea or constipation. Patient denies any urinary symptoms. Patient does admit to previous history of a or ovarian cysts at the age of 72. Patient also admits to a history of gonorrhea 2 years ago.   Past Medical History:  Diagnosis Date  . Cholecystitis   . Hx of gallstones 2016    Patient Active Problem List   Diagnosis Date Noted  . Postpartum care following cesarean delivery 03/24/2016    Past Surgical History:  Procedure Laterality Date  . CESAREAN SECTION    . CESAREAN SECTION WITH BILATERAL TUBAL LIGATION N/A 05/07/2016   Procedure: CESAREAN SECTION WITH RIGHT TUBAL LIGATION;  Surgeon: Vena Austria, MD;  Location: [facility] ORS;  Service: Obstetrics;  Laterality: N/A;  . CHOLECYSTECTOMY N/A 05/19/2015   Procedure: LAPAROSCOPIC CHOLECYSTECTOMY;  Surgeon: Natale Lay, MD;  Location: [facility] ORS;  Service: General;  Laterality: N/A;    Prior to Admission medications   Medication Sig Start Date End Date Taking? Authorizing Provider  cephALEXin (KEFLEX) 500 MG capsule Take 1 capsule (500 mg total) by mouth 3 (three) times daily. 09/23/16   Governor Rooks, MD  doxycycline (VIBRAMYCIN) 50 MG capsule Take 2 capsules (100 mg total) by mouth 2 (two) times daily. 05/04/17 05/18/17  Darci Current, MD  ibuprofen (ADVIL,MOTRIN) 600 MG tablet Take 1 tablet (600 mg total) by mouth every 8 (eight) hours as needed. 09/23/16    Governor Rooks, MD  oxyCODONE-acetaminophen (PERCOCET/ROXICET) 5-325 MG tablet Take 1-2 tablets by mouth every 6 (six) hours as needed for moderate pain or severe pain (pain scale 4-7). 05/09/16   Farrel Conners, CNM  oxyCODONE-acetaminophen (ROXICET) 5-325 MG tablet Take 1 tablet by mouth every 4 (four) hours as needed for severe pain. 05/04/17   Darci Current, MD  Prenatal Vit-Fe Fumarate-FA (PRENATAL VITAMIN) 27-0.8 MG TABS Take 1 tablet by mouth daily. Patient not taking: Reported on 05/07/2016 04/29/16   Farrel Conners, CNM    Allergies No known drug allergies.  Family History  Problem Relation Age of Onset  . Cancer Mother   . Kidney disease Father        Currently on Dialysis    Social History Social History  Substance Use Topics  . Smoking status: Never Smoker  . Smokeless tobacco: Never Used  . Alcohol use No    Review of Systems Constitutional: No fever/chills Eyes: No visual changes. ENT: No sore throat. Cardiovascular: Denies chest pain. Respiratory: Denies shortness of breath. Gastrointestinal: Positive for left lower abdominal pain.  No nausea, no vomiting.  No diarrhea.  No constipation. Genitourinary: Negative for dysuria. Musculoskeletal: Negative for neck pain.  Negative for back pain. Integumentary: Negative for rash. Neurological: Negative for headaches, focal weakness or numbness.   ____________________________________________   PHYSICAL EXAM:  VITAL SIGNS: ED Triage Vitals  Enc Vitals Group     BP 05/04/17 0229 124/87     Pulse Rate 05/04/17 0229 70  Resp 05/04/17 0229 (!) 24     Temp 05/04/17 0229 98.3 F (36.8 C)     Temp Source 05/04/17 0229 Oral     SpO2 05/04/17 0229 97 %     Weight 05/04/17 0225 81.6 kg (180 lb)     Height 05/04/17 0225 1.676 m (5\' 6" )     Head Circumference --      Peak Flow --      Pain Score 05/04/17 0225 10     Pain Loc --      Pain Edu? --      Excl. in GC? --     Constitutional: Alert and  oriented. Apparent discomfort Eyes: Conjunctivae are normal.  Head: Atraumatic. Mouth/Throat: Mucous membranes are moist. Oropharynx non-erythematous. Neck: No stridor.   Cardiovascular: Normal rate, regular rhythm. Good peripheral circulation. Grossly normal heart sounds. Respiratory: Normal respiratory effort.  No retractions. Lungs CTAB. Gastrointestinal: Soft and nontender. No distention.  Genitourinary: Cervical motion tenderness on exam  yellow/white vaginal discharge Musculoskeletal: No lower extremity tenderness nor edema. No gross deformities of extremities. Neurologic:  Normal speech and language. No gross focal neurologic deficits are appreciated.  Skin:  Skin is warm, dry and intact. No rash noted. Psychiatric: Mood and affect are normal. Speech and behavior are normal.  ____________________________________________   LABS (all labs ordered are listed, but only abnormal results are displayed)  Labs Reviewed  URINALYSIS, COMPLETE (UACMP) WITH MICROSCOPIC - Abnormal; Notable for the following:       Result Value   Color, Urine YELLOW (*)    APPearance CLEAR (*)    Bacteria, UA RARE (*)    Squamous Epithelial / LPF 0-5 (*)    All other components within normal limits  WET PREP, GENITAL  CHLAMYDIA/NGC RT PCR ([facility] ONLY)  COMPREHENSIVE METABOLIC PANEL  CBC  POC URINE PREG, ED  POCT PREGNANCY, URINE    RADIOLOGY I, Kennan N BROWN, personally viewed and evaluated these images (plain radiographs) as part of my medical decision making, as well as reviewing the written report by the radiologist.  US Transvaginal Non-ob  Result Date: 05/04/2017 CLINICAL DATA:  Left-sided pelvic pain. EXAM: TRANSABDOMINAL AND TRANSVAGINAL ULTRASOUND OF PELVIS TECHNIQUE: Both transabdominal and transvaginal ultrasound examinations of the pelvis were performed. Transabdominal technique was performed for global imaging of the pelvis including uterus, ovaries, adnexal regions, and  pelvic cul-de-sac. It was necessary to proceed with endovaginal exam following the transabdominal exam to visualize the endometrium and ovaries. COMPARISON:  None FINDINGS: Uterus Measurements: 7.4 x 3.8 x 5.5 cm. No fibroids or other mass visualized. Endometrium Thickness: 5.2 mm. Minimal fluid in the endocervical canal. No focal abnormality visualized. Right ovary Measurements: 4.5 x 2.7 x 4.7 cm. Follicular cyst measures 2.5 cm. Hypoechoic 1.7 cm lesion is likely an involuting corpus luteum. There is a parovarian cystic structure measuring 1.5 x 0.5 x 1.5 cm in the right adnexa. Left ovary Not visualized.  No left adnexal mass. Other findings No abnormal free fluid.  Trace free fluid is physiologic. IMPRESSION: 1. Left ovary not identified, no adnexal mass. 2. Follicular cyst in the right ovary measuring 2.5 cm. No dedicated imaging follow-up is needed. There is a probable para-ovarian cyst adjacent to the right ovary measuring 1.5 cm. 3. Minimal fluid in the endocervical canal is likely physiologic. Otherwise normal sonographic appearance of the uterus. Electronically Signed   By: Rubye Oaks M.D.   On: 05/04/2017 05:45   US Pelvis Complete  Result Date: 05/04/2017 CLINICAL DATA:  Left-sided pelvic pain. EXAM: TRANSABDOMINAL AND TRANSVAGINAL ULTRASOUND OF PELVIS TECHNIQUE: Both transabdominal and transvaginal ultrasound examinations of the pelvis were performed. Transabdominal technique was performed for global imaging of the pelvis including uterus, ovaries, adnexal regions, and pelvic cul-de-sac. It was necessary to proceed with endovaginal exam following the transabdominal exam to visualize the endometrium and ovaries. COMPARISON:  None FINDINGS: Uterus Measurements: 7.4 x 3.8 x 5.5 cm. No fibroids or other mass visualized. Endometrium Thickness: 5.2 mm. Minimal fluid in the endocervical canal. No focal abnormality visualized. Right ovary Measurements: 4.5 x 2.7 x 4.7 cm. Follicular cyst measures 2.5  cm. Hypoechoic 1.7 cm lesion is likely an involuting corpus luteum. There is a parovarian cystic structure measuring 1.5 x 0.5 x 1.5 cm in the right adnexa. Left ovary Not visualized.  No left adnexal mass. Other findings No abnormal free fluid.  Trace free fluid is physiologic. IMPRESSION: 1. Left ovary not identified, no adnexal mass. 2. Follicular cyst in the right ovary measuring 2.5 cm. No dedicated imaging follow-up is needed. There is a probable para-ovarian cyst adjacent to the right ovary measuring 1.5 cm. 3. Minimal fluid in the endocervical canal is likely physiologic. Otherwise normal sonographic appearance of the uterus. Electronically Signed   By: Rubye Oaks M.D.   On: 05/04/2017 05:45   Ct Abdomen Pelvis W Contrast  Result Date: 05/04/2017 CLINICAL DATA:  Left lower quadrant pain. EXAM: CT ABDOMEN AND PELVIS WITH CONTRAST TECHNIQUE: Multidetector CT imaging of the abdomen and pelvis was performed using the standard protocol following bolus administration of intravenous contrast. CONTRAST:  ISOVUE-300 IOPAMIDOL (ISOVUE-300) INJECTION 61% COMPARISON:  Pelvic ultrasound earlier this day. FINDINGS: Lower chest: The lung bases are clear. Hepatobiliary: No focal liver abnormality is seen. Status post cholecystectomy. No biliary dilatation. Pancreas: No ductal dilatation or inflammation. Spleen: Normal in size without focal abnormality. Adrenals/Urinary Tract: Adrenal glands are unremarkable. Kidneys are normal, without renal calculi, focal lesion, or hydronephrosis. Bladder is unremarkable. Stomach/Bowel: Stomach is within normal limits. Appendix appears normal. No evidence of bowel wall thickening, distention, or inflammatory changes. Moderate colonic stool burden. Mild fecalization of distal small bowel contents. Vascular/Lymphatic: No significant vascular findings are present. No enlarged abdominal or pelvic lymph nodes. Reproductive: Right ovarian cyst as seen on ultrasound. The uterus  is unremarkable. Left ovary is tentatively but not confidently visualized on CT. No adnexal mass. Other: No free air, free fluid, or intra-abdominal fluid collection. Musculoskeletal: There are no acute or suspicious osseous abnormalities. IMPRESSION: 1. No acute abnormality. 2. Moderate colonic stool burden and fecalization of distal small bowel contents which can be seen with constipation. There is otherwise no explanation for left lower quadrant pain. Electronically Signed   By: Rubye Oaks M.D.   On: 05/04/2017 06:08      Procedures   ____________________________________________   INITIAL IMPRESSION / ASSESSMENT AND PLAN / ED COURSE  Pertinent labs & imaging results that were available during my care of the patient were reviewed by me and considered in my medical decision making (see chart for details).  26 year old female presenting with history of physical exam concerning for possible PID as such patient received ceftriaxone IM and will be prescribed doxycycline.      ____________________________________________  FINAL CLINICAL IMPRESSION(S) / ED DIAGNOSES  Final diagnoses:  PID (acute pelvic inflammatory disease)     MEDICATIONS GIVEN DURING THIS VISIT:  Medications  azithromycin (ZITHROMAX) tablet 1,000 mg (1,000 mg Oral Not Given 05/04/17 0650)  morphine 2 MG/ML injection 2 mg (  2 mg Intravenous Given 05/04/17 0423)  ondansetron (ZOFRAN) injection 4 mg (4 mg Intravenous Given 05/04/17 0421)  iopamidol (ISOVUE-300) 61 % injection 100 mL (100 mLs Intravenous Contrast Given 05/04/17 0544)  morphine 4 MG/ML injection 4 mg (4 mg Intravenous Given 05/04/17 0541)  cefTRIAXone (ROCEPHIN) injection 250 mg (250 mg Intramuscular Given 05/04/17 0653)  azithromycin (ZITHROMAX) powder 1 g (1 g Oral Given 05/04/17 0645)     NEW OUTPATIENT MEDICATIONS STARTED DURING THIS VISIT:  New Prescriptions   DOXYCYCLINE (VIBRAMYCIN) 50 MG CAPSULE    Take 2 capsules (100 mg total) by mouth 2  (two) times daily.   OXYCODONE-ACETAMINOPHEN (ROXICET) 5-325 MG TABLET    Take 1 tablet by mouth every 4 (four) hours as needed for severe pain.    Modified Medications   No medications on file    Discontinued Medications   No medications on file     Note:  This document was prepared using Dragon voice recognition software and may include unintentional dictation errors.    Darci Current, MD 05/04/17 [phone]

## 2017-05-04 NOTE — ED Notes (Signed)
Patient transported to CT 

## 2017-05-04 NOTE — ED Notes (Signed)
Patient returned from ultrasound.

## 2017-05-04 NOTE — ED Notes (Signed)
MD Brown at bedside.

## 2017-05-04 NOTE — ED Notes (Signed)
Patient c/o LLQ abdominal pain X 1 week with increasing severity today. Pt reports pain woke her from sleep at 0200 today, and patient became diaphoretic.  Patient reports 1 emesis yesterday morning. Pt denies current nausea. Pt denies diarrhea.    Patient reports she had her tubes tied in August 2017.

## 2017-12-17 ENCOUNTER — Emergency Department: Payer: Medicaid Other

## 2017-12-17 ENCOUNTER — Emergency Department
Admission: EM | Admit: 2017-12-17 | Discharge: 2017-12-17 | Disposition: A | Discharge status: Home / Self Care | Payer: Medicaid Other | Attending: Emergency Medicine | Admitting: Emergency Medicine

## 2017-12-17 ENCOUNTER — Encounter: Payer: Self-pay | Admitting: Emergency Medicine

## 2017-12-17 DIAGNOSIS — F411 Generalized anxiety disorder: Secondary | ICD-10-CM

## 2017-12-17 DIAGNOSIS — Z9049 Acquired absence of other specified parts of digestive tract: Secondary | ICD-10-CM | POA: Diagnosis not present

## 2017-12-17 DIAGNOSIS — F419 Anxiety disorder, unspecified: Secondary | ICD-10-CM | POA: Diagnosis not present

## 2017-12-17 DIAGNOSIS — R0789 Other chest pain: Secondary | ICD-10-CM | POA: Diagnosis not present

## 2017-12-17 DIAGNOSIS — R079 Chest pain, unspecified: Secondary | ICD-10-CM | POA: Diagnosis present

## 2017-12-17 LAB — POCT PREGNANCY, URINE: PREG TEST UR: NEGATIVE

## 2017-12-17 LAB — CBC
HCT: 38.6 % (ref 35.0–47.0)
Hemoglobin: 12.9 g/dL (ref 12.0–16.0)
MCH: 30.2 pg (ref 26.0–34.0)
MCHC: 33.5 g/dL (ref 32.0–36.0)
MCV: 90 fL (ref 80.0–100.0)
PLATELETS: 305 10*3/uL (ref 150–440)
RBC: 4.28 MIL/uL (ref 3.80–5.20)
RDW: 13.7 % (ref 11.5–14.5)
WBC: 9.9 10*3/uL (ref 3.6–11.0)

## 2017-12-17 LAB — BASIC METABOLIC PANEL
Anion gap: 6 (ref 5–15)
BUN: 13 mg/dL (ref 6–20)
CALCIUM: 8.9 mg/dL (ref 8.9–10.3)
CHLORIDE: 106 mmol/L (ref 101–111)
CO2: 24 mmol/L (ref 22–32)
CREATININE: 0.92 mg/dL (ref 0.44–1.00)
Glucose, Bld: 74 mg/dL (ref 65–99)
Potassium: 3.7 mmol/L (ref 3.5–5.1)
SODIUM: 136 mmol/L (ref 135–145)

## 2017-12-17 LAB — TROPONIN I

## 2017-12-17 NOTE — ED Notes (Signed)
Pt states earlier today central CP began with dizziness during an argument. Pt states pain is less now but still there. Alert, oriented, ambulatory. No distress noted. Family at bedside.
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
## 2018-02-20 ENCOUNTER — Emergency Department
Admission: EM | Admit: 2018-02-20 | Discharge: 2018-02-20 | Disposition: A | Discharge status: Home / Self Care | Payer: Medicaid Other | Attending: Student in an Organized Health Care Education/Training Program | Admitting: Student in an Organized Health Care Education/Training Program

## 2018-02-20 ENCOUNTER — Encounter: Payer: Self-pay | Admitting: Emergency Medicine

## 2018-02-20 ENCOUNTER — Other Ambulatory Visit: Payer: Self-pay

## 2018-02-20 ENCOUNTER — Emergency Department: Payer: Medicaid Other

## 2018-02-20 DIAGNOSIS — F17210 Nicotine dependence, cigarettes, uncomplicated: Secondary | ICD-10-CM | POA: Insufficient documentation

## 2018-02-20 DIAGNOSIS — R609 Edema, unspecified: Secondary | ICD-10-CM

## 2018-02-20 DIAGNOSIS — Z9049 Acquired absence of other specified parts of digestive tract: Secondary | ICD-10-CM | POA: Insufficient documentation

## 2018-02-20 DIAGNOSIS — M79605 Pain in left leg: Secondary | ICD-10-CM | POA: Diagnosis present

## 2018-02-20 DIAGNOSIS — L03116 Cellulitis of left lower limb: Secondary | ICD-10-CM | POA: Diagnosis not present

## 2018-02-20 LAB — POCT PREGNANCY, URINE: PREG TEST UR: NEGATIVE

## 2018-02-20 MED ORDER — SULFAMETHOXAZOLE-TRIMETHOPRIM 800-160 MG PO TABS: 1.0000 | ORAL_TABLET | Freq: Two times a day (BID) | ORAL | 0 refills | Status: DC

## 2018-02-20 MED ORDER — SULFAMETHOXAZOLE-TRIMETHOPRIM 800-160 MG PO TABS
1.0000 | ORAL_TABLET | Freq: Once | ORAL | Status: AC
  Administered 2018-02-20: 1 via ORAL
  Filled 2018-02-20: qty 1

## 2018-02-20 NOTE — ED Triage Notes (Signed)
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  Denies SOB.  Ultrasound and pregnancy test VORB per Dr. Roxan Hockeyobinson.

## 2018-02-20 NOTE — Discharge Instructions (Signed)
You have ultrasound evidence of inflammation and possible infection in the lower leg. Take the antibiotic as directed. Apply warm compresses and rest with the leg elevated. Follow-up with [facility] as needed.

## 2018-02-22 NOTE — ED Provider Notes (Signed)
[facility] Emergency Department Provider Note ____________________________________________  Time seen: 2105  I have reviewed the triage vital signs and the nursing notes.  HISTORY  Chief Complaint  Leg Pain and Foot Pain  HPI Tracy Glover is a 27 y.o. female to the ED accompanied by her mother, for evaluation of left lower leg pain for the last week.  Patient describes the area is gotten more painful since returning to work 2 days ago.  She notes palpable knots under the skin on the lateral side of her lower leg near the ankle.  She also notes some erythema and tenderness to the same area. Patient denies any direct trauma to the left lower leg, but does admit dropping a small television on top of her foot a week prior.  She describes symptoms worsened after she experienced prolonged walking and standing at her job.  Yesterday she began to experience some numbness and tingling intermittently to her toes.  She denies any laceration, abrasion, weeping, or purulent drainage from the area.  She also denies any fevers, chills, sweats, chest pain, nausea, or vomiting.  Denies any alleviating measures to counter her symptoms.  Past Medical History:  Diagnosis Date  . Cholecystitis   . Hx of gallstones 2016    Patient Active Problem List   Diagnosis Date Noted  . Postpartum care following cesarean delivery 03/24/2016    Past Surgical History:  Procedure Laterality Date  . CESAREAN SECTION    . CESAREAN SECTION WITH BILATERAL TUBAL LIGATION N/A 05/07/2016   Procedure: CESAREAN SECTION WITH RIGHT TUBAL LIGATION;  Surgeon: Vena AustriaAndreas Staebler, MD;  Location: [facility] ORS;  Service: Obstetrics;  Laterality: N/A;  . CHOLECYSTECTOMY N/A 05/19/2015   Procedure: LAPAROSCOPIC CHOLECYSTECTOMY;  Surgeon: Natale LayMark Bird, MD;  Location: [facility] ORS;  Service: General;  Laterality: N/A;    Prior to Admission medications   Medication Sig Start Date End Date Taking? Authorizing Provider   cephALEXin (KEFLEX) 500 MG capsule Take 1 capsule (500 mg total) by mouth 3 (three) times daily. 09/23/16   Governor RooksLord, Rebecca, MD  ibuprofen (ADVIL,MOTRIN) 600 MG tablet Take 1 tablet (600 mg total) by mouth every 8 (eight) hours as needed. 09/23/16   Governor RooksLord, Rebecca, MD  oxyCODONE-acetaminophen (PERCOCET/ROXICET) 5-325 MG tablet Take 1-2 tablets by mouth every 6 (six) hours as needed for moderate pain or severe pain (pain scale 4-7). 05/09/16   Farrel ConnersGutierrez, Colleen, CNM  oxyCODONE-acetaminophen (ROXICET) 5-325 MG tablet Take 1 tablet by mouth every 4 (four) hours as needed for severe pain. 05/04/17   Darci CurrentBrown, [place] N, MD  Prenatal Vit-Fe Fumarate-FA (PRENATAL VITAMIN) 27-0.8 MG TABS Take 1 tablet by mouth daily. Patient not taking: Reported on 05/07/2016 04/29/16   Farrel ConnersGutierrez, Colleen, CNM  sulfamethoxazole-trimethoprim (BACTRIM DS,SEPTRA DS) 800-160 MG tablet Take 1 tablet by mouth 2 (two) times daily. 02/20/18   Olean Sangster, Charlesetta IvoryJenise V Bacon, PA-C    Allergies Patient has no known allergies.  Family History  Problem Relation Age of Onset  . Cancer Mother   . Kidney disease Father        Currently on Dialysis    Social History Social History   Tobacco Use  . Smoking status: Current Some Day Smoker    Types: Cigarettes  . Smokeless tobacco: Never Used  Substance Use Topics  . Alcohol use: Yes    Comment: occ.  . Drug use: Yes    Types: Marijuana    Comment: Former user: stop using after finding out she was pregnant per patient  Review of Systems  Constitutional: Negative for fever. Cardiovascular: Negative for chest pain. Respiratory: Negative for shortness of breath. Musculoskeletal: Negative for back pain. Skin: Negative for rash. LLE swelling as above Neurological: Negative for headaches, focal weakness or numbness. ____________________________________________  PHYSICAL EXAM:  VITAL SIGNS: ED Triage Vitals  Enc Vitals Group     BP 02/20/18 1833 122/73     Pulse Rate 02/20/18  1833 85     Resp 02/20/18 1833 16     Temp 02/20/18 1833 97.9 F (36.6 C)     Temp Source 02/20/18 1833 Oral     SpO2 02/20/18 1833 99 %     Weight 02/20/18 1833 180 lb (81.6 kg)     Height 02/20/18 1833 5\' 6"  (1.676 m)     Head Circumference --      Peak Flow --      Pain Score 02/20/18 1839 7     Pain Loc --      Pain Edu? --      Excl. in GC? --     Constitutional: Alert and oriented. Well appearing and in no distress. Head: Normocephalic and atraumatic. Eyes: Conjunctivae are normal. Normal extraocular movements Cardiovascular: Normal rate, regular rhythm. Normal distal pulses. No peripheral edema Respiratory: Normal respiratory effort. No wheezes/rales/rhonchi. Musculoskeletal: LLE without deformity, dislocation, or joint effusions. Nontender with normal range of motion in all extremities.  Neurologic:  Normal gait without ataxia. Normal speech and language. No gross focal neurologic deficits are appreciated. Skin:  Skin is warm, dry and intact. No rash, abrasion, or laceration noted. Lateral left lower leg with palpable, area of erythema, edema, and induration.  ____________________________________________   LABS (pertinent positives/negatives) Labs Reviewed  POC URINE PREG, ED  POCT PREGNANCY, URINE  ____________________________________________   RADIOLOGY  [place] LLE  IMPRESSION: Ill-defined echogenic area within the superficial soft tissues posterior to the LEFT ankle, corresponding to the area of clinical concern, most suggestive of edema (cellulitis?). No circumscribed mass or abscess collection. If clinical findings persist or worsen, would consider MRI for more definitive characterization. ____________________________________________  PROCEDURES  Procedures Bactrim DS 1 PO ____________________________________________  INITIAL IMPRESSION / ASSESSMENT AND PLAN / ED COURSE  Patient with ED evaluation of left lower extremity swelling and tenderness to the  skin.  Her ultrasound is reassuring for any obvious area of abscess but does give some concern for superficial cellulitis.  Patient will be treated empirically for a non-weeping sialitis of the left lower extremity.  She started on Bactrim here in the ED and will continue the course as prescribed.  She is encouraged to rest with the leg elevated and apply warm compresses to promote healing.  She will follow-up with [facility] or return to the ED as needed. ____________________________________________  FINAL CLINICAL IMPRESSION(S) / ED DIAGNOSES  Final diagnoses:  Swelling  Cellulitis of left lower extremity     Kalyani Maeda, Charlesetta Ivory, PA-C 02/22/18 1017    Willy Eddy, MD 02/22/18 1523

## 2018-03-11 IMAGING — US US TRANSVAGINAL NON-OB
1 series · 13 of 25 positions shown · non-contrast
Comparison: None

CLINICAL DATA: Left-sided pelvic pain.

EXAM:
TRANSABDOMINAL AND TRANSVAGINAL ULTRASOUND OF PELVIS
TECHNIQUE: Both transabdominal and transvaginal ultrasound examinations of the
pelvis were performed. Transabdominal technique was performed for
global imaging of the pelvis including uterus, ovaries, adnexal
regions, and pelvic cul-de-sac. It was necessary to proceed with
endovaginal exam following the transabdominal exam to visualize the
endometrium and ovaries.

[Series 1: us transvaginal non-ob · 0.24mm/px · 13 of 88 slices shown]
[im 1/88]
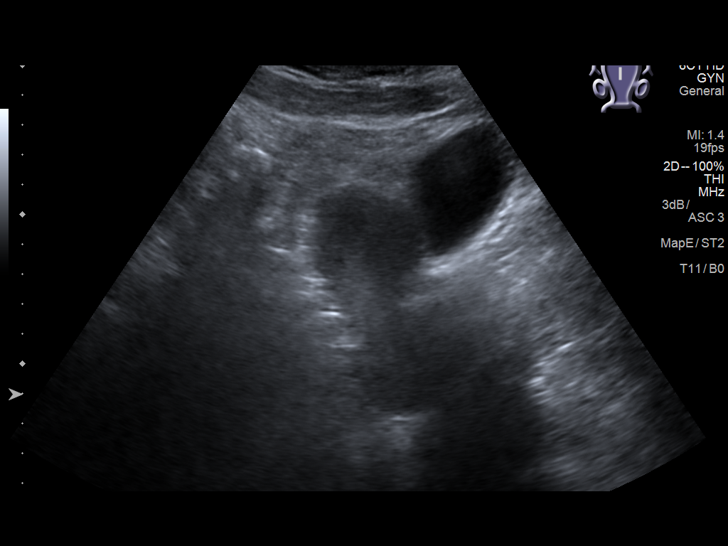
[im 8/88]
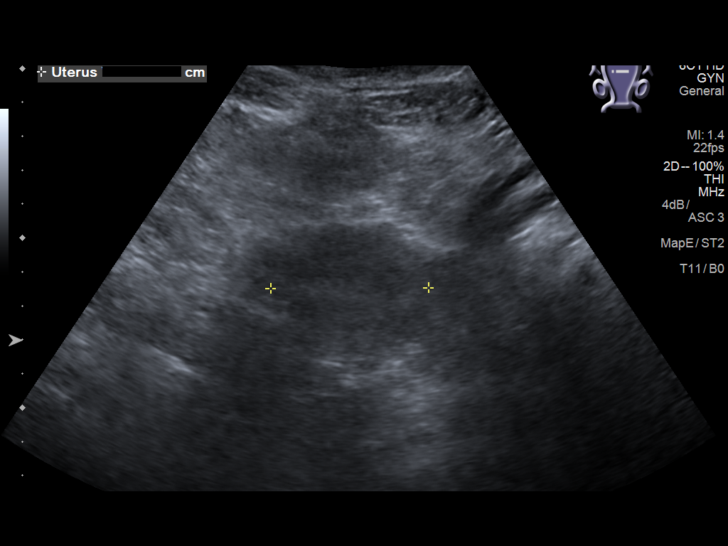
[im 15/88]
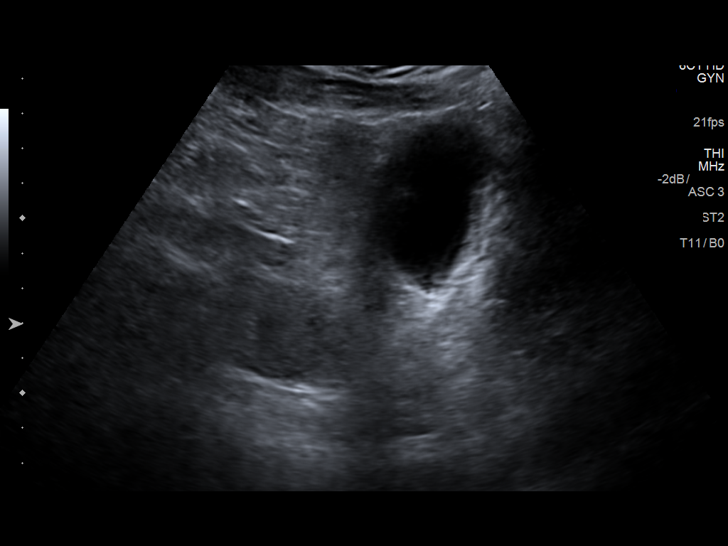
[im 22/88]
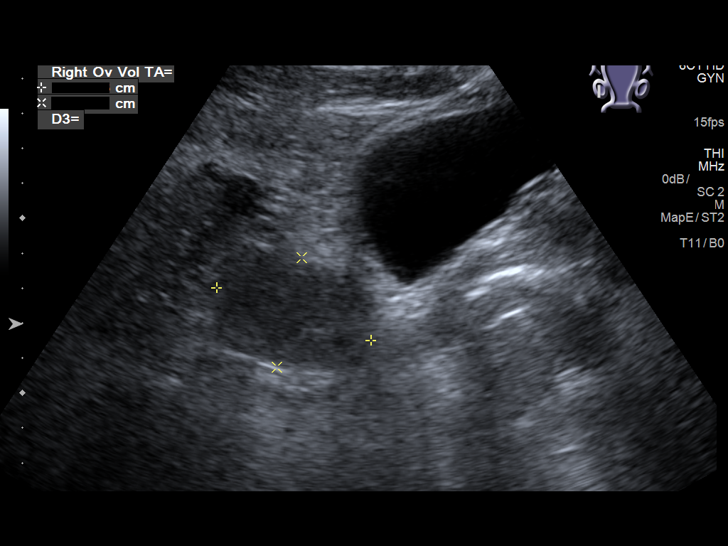
[im 30/88]
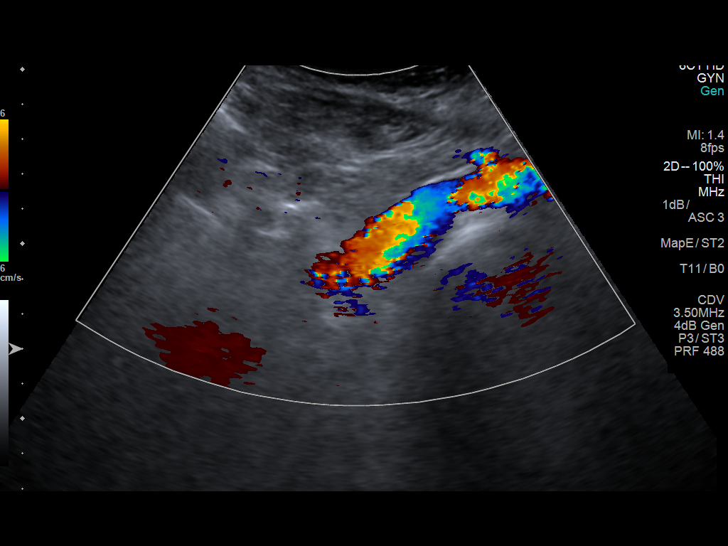
[im 37/88]
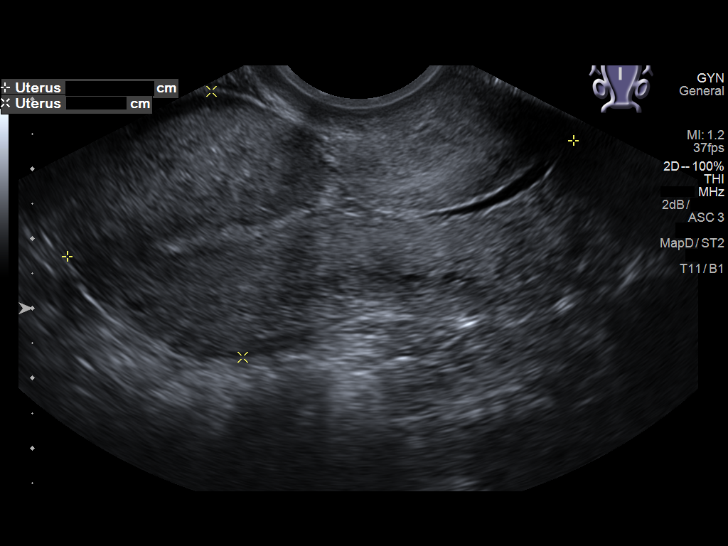
[im 44/88]
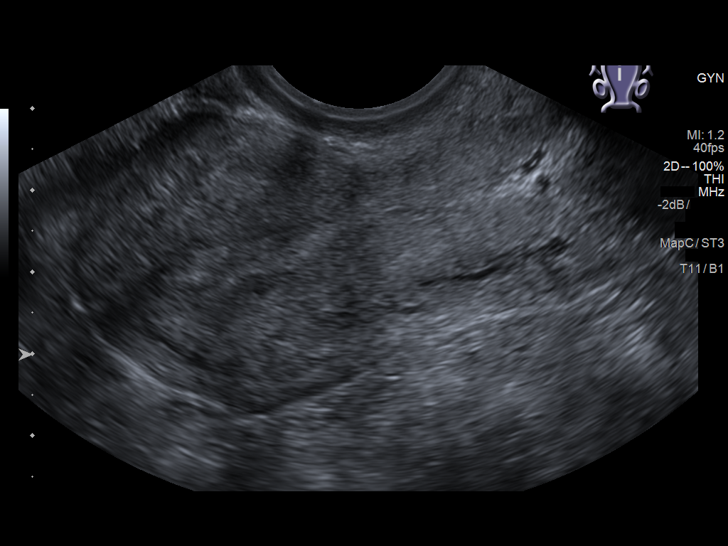
[im 51/88]
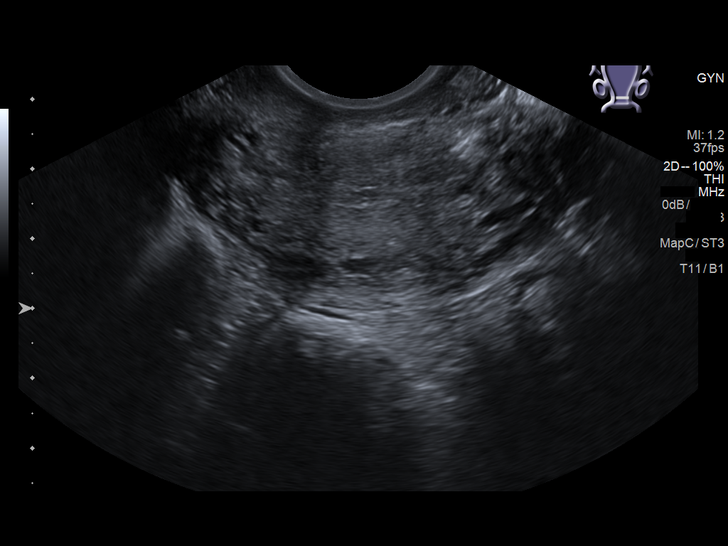
[im 59/88]
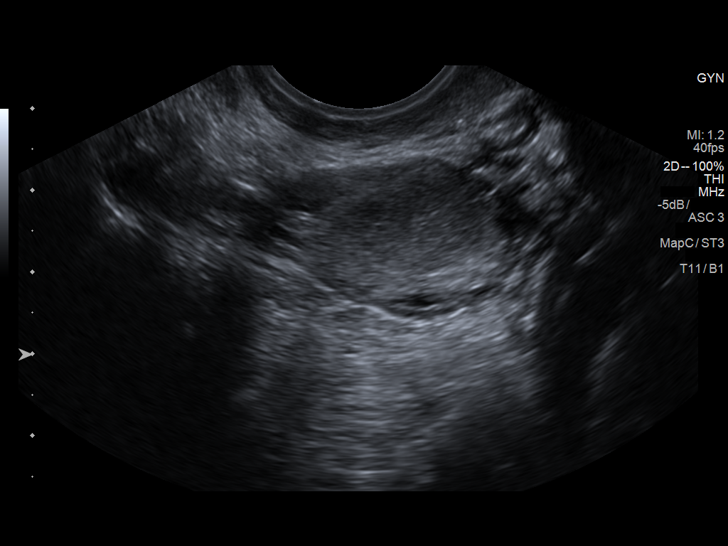
[im 66/88]
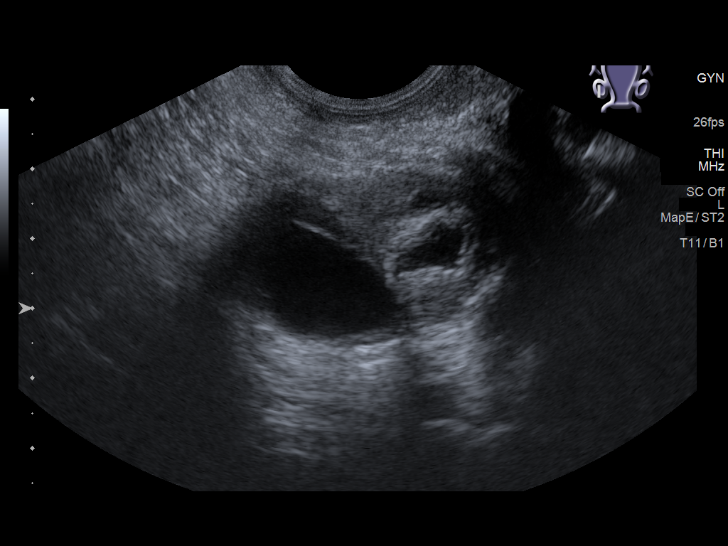
[im 73/88]
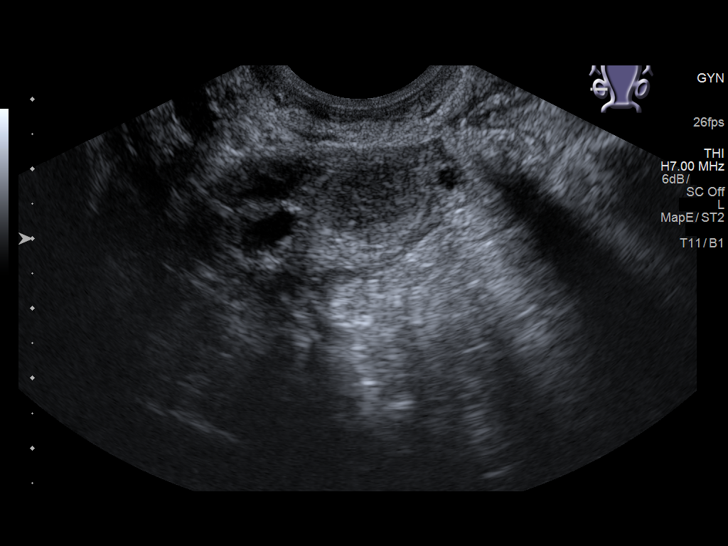
[im 80/88]
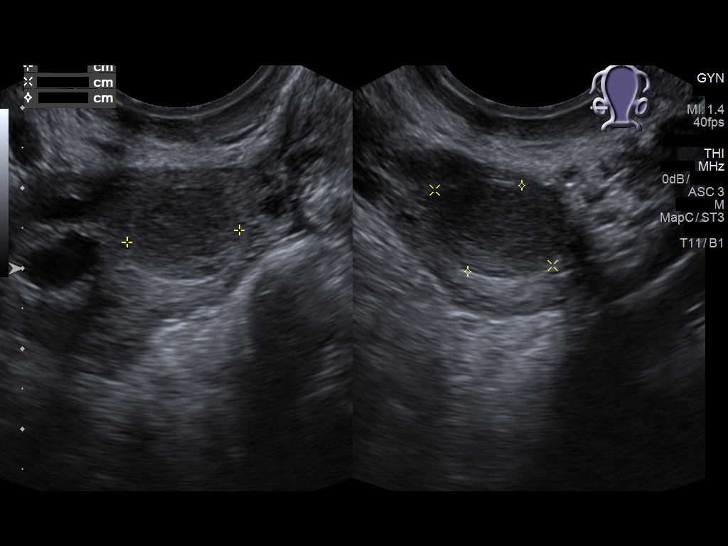
[im 88/88]
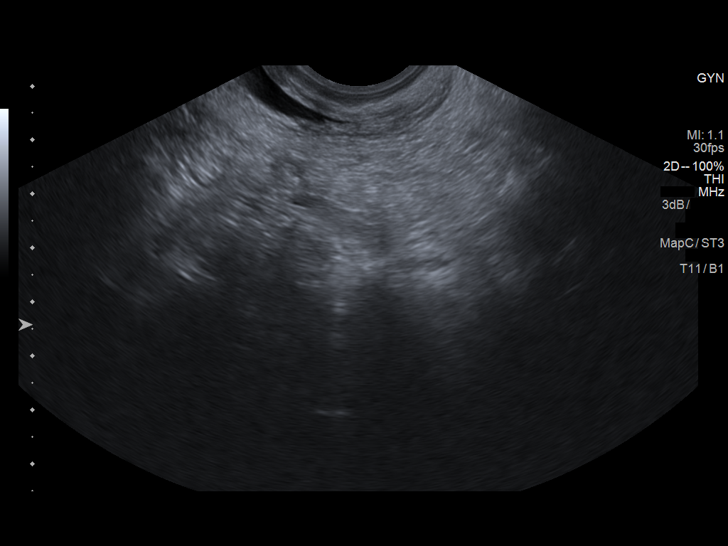

[13 of 25 positions shown; findings below may reference images not displayed]

FINDINGS: Uterus

Measurements: 7.4 x 3.8 x 5.5 cm. No fibroids or other mass
visualized.

Endometrium

Thickness: 5.2 mm. Minimal fluid in the endocervical canal. No focal
abnormality visualized.

Right ovary

Measurements: 4.5 x 2.7 x 4.7 cm. Follicular cyst measures 2.5 cm.
Hypoechoic 1.7 cm lesion is likely an involuting corpus luteum.
There is a parovarian cystic structure measuring 1.5 x 0.5 x 1.5 cm
in the right adnexa.

Left ovary

Not visualized.  No left adnexal mass.

Other findings

No abnormal free fluid.  Trace free fluid is physiologic.
IMPRESSION: 1. Left ovary not identified, no adnexal mass.
2. Follicular cyst in the right ovary measuring 2.5 cm. No dedicated
imaging follow-up is needed. There is a probable para-ovarian cyst
adjacent to the right ovary measuring 1.5 cm.
3. Minimal fluid in the endocervical canal is likely physiologic.
Otherwise normal sonographic appearance of the uterus.

## 2018-05-30 ENCOUNTER — Emergency Department
Admission: EM | Admit: 2018-05-30 | Discharge: 2018-05-30 | Disposition: A | Discharge status: Home / Self Care | Payer: Medicaid Other | Attending: Emergency Medicine | Admitting: Emergency Medicine

## 2018-05-30 ENCOUNTER — Other Ambulatory Visit: Payer: Self-pay

## 2018-05-30 ENCOUNTER — Encounter: Payer: Self-pay | Admitting: Emergency Medicine

## 2018-05-30 DIAGNOSIS — F17210 Nicotine dependence, cigarettes, uncomplicated: Secondary | ICD-10-CM | POA: Diagnosis not present

## 2018-05-30 DIAGNOSIS — R102 Pelvic and perineal pain: Secondary | ICD-10-CM | POA: Insufficient documentation

## 2018-05-30 DIAGNOSIS — N7689 Other specified inflammation of vagina and vulva: Secondary | ICD-10-CM | POA: Insufficient documentation

## 2018-05-30 DIAGNOSIS — N9489 Other specified conditions associated with female genital organs and menstrual cycle: Secondary | ICD-10-CM

## 2018-05-30 LAB — URINALYSIS, ROUTINE W REFLEX MICROSCOPIC
Bilirubin Urine: NEGATIVE
GLUCOSE, UA: NEGATIVE mg/dL
HGB URINE DIPSTICK: NEGATIVE
Ketones, ur: NEGATIVE mg/dL
Leukocytes, UA: NEGATIVE
Nitrite: NEGATIVE
PH: 6 (ref 5.0–8.0)
Protein, ur: NEGATIVE mg/dL
SPECIFIC GRAVITY, URINE: 1.027 (ref 1.005–1.030)

## 2018-05-30 MED ORDER — IBUPROFEN 600 MG PO TABS: 600.0000 mg | ORAL_TABLET | Freq: Three times a day (TID) | ORAL | 0 refills | Status: DC | PRN

## 2018-05-30 MED ORDER — SULFAMETHOXAZOLE-TRIMETHOPRIM 800-160 MG PO TABS: 1.0000 | ORAL_TABLET | Freq: Two times a day (BID) | ORAL | 0 refills | Status: DC

## 2018-05-30 NOTE — ED Provider Notes (Signed)
[facility] Emergency Department Provider Note ____________________________________________  Time seen: 1500  I have reviewed the triage vital signs and the nursing notes.  HISTORY  Chief Complaint  Cyst   HPI Tracy Glover is a 27 y.o. female presents to the ER with c/o of a cyst to her right labia. She reports the cyst has been there for 2 years. She had it drained at that time. Over the last 2 months, she has noticed increased swelling of the area. Over the last few days, she has noticed increased pain and suprapubic pressure. She denies any drainage from the area. She denies urgency, frequency or dysuria. She denies vaginal discharge, odor or abnormal bleeding. She is sexually active but denies possibility of pregnancy. She denies fever, chills or body aches. She has not tried anything OTC for her symptoms.  Past Medical History:  Diagnosis Date  . Cholecystitis   . Hx of gallstones 2016    Patient Active Problem List   Diagnosis Date Noted  . Postpartum care following cesarean delivery 03/24/2016    Past Surgical History:  Procedure Laterality Date  . CESAREAN SECTION    . CESAREAN SECTION WITH BILATERAL TUBAL LIGATION N/A 05/07/2016   Procedure: CESAREAN SECTION WITH RIGHT TUBAL LIGATION;  Surgeon: Vena AustriaAndreas Staebler, MD;  Location: [facility] ORS;  Service: Obstetrics;  Laterality: N/A;  . CHOLECYSTECTOMY N/A 05/19/2015   Procedure: LAPAROSCOPIC CHOLECYSTECTOMY;  Surgeon: Natale LayMark Bird, MD;  Location: [facility] ORS;  Service: General;  Laterality: N/A;    Prior to Admission medications   Medication Sig Start Date End Date Taking? Authorizing Provider  ibuprofen (ADVIL,MOTRIN) 600 MG tablet Take 1 tablet (600 mg total) by mouth every 8 (eight) hours as needed. 05/30/18   Lorre MunroeBaity, Regina W, NP  Prenatal Vit-Fe Fumarate-FA (PRENATAL VITAMIN) 27-0.8 MG TABS Take 1 tablet by mouth daily. Patient not taking: Reported on 05/07/2016 04/29/16   Farrel ConnersGutierrez, Colleen, CNM   sulfamethoxazole-trimethoprim (BACTRIM DS,SEPTRA DS) 800-160 MG tablet Take 1 tablet by mouth 2 (two) times daily. 05/30/18   Lorre MunroeBaity, Regina W, NP    Allergies Patient has no known allergies.  Family History  Problem Relation Age of Onset  . Cancer Mother   . Kidney disease Father        Currently on Dialysis    Social History Social History   Tobacco Use  . Smoking status: Current Some Day Smoker    Types: Cigarettes  . Smokeless tobacco: Never Used  Substance Use Topics  . Alcohol use: Yes    Comment: occ.  . Drug use: Yes    Types: Marijuana    Comment: Former user: stop using after finding out she was pregnant per patient     Review of Systems  Constitutional: Negative for fever, chills or body aches. Gastrointestinal: Positive for suprapubic pain. Negative for vomiting and diarrhea. Genitourinary: Negative for urgency, frequency, dysuria, vaginal discharge, odor or abnormal bleeding.. Skin: Positive for cyst of right labia.  ____________________________________________  PHYSICAL EXAM:  VITAL SIGNS: ED Triage Vitals [05/30/18 1337]  Enc Vitals Group     BP 126/72     Pulse Rate 62     Resp 20     Temp 98.2 F (36.8 C)     Temp Source Oral     SpO2 97 %     Weight 180 lb (81.6 kg)     Height 5\' 6"  (1.676 m)     Head Circumference      Peak Flow  Pain Score 6     Pain Loc      Pain Edu?      Excl. in GC?     Constitutional: Alert and oriented. Well appearing and in no distress. Cardiovascular: Normal rate, regular rhythm.  Respiratory: Normal respiratory effort. No wheezes/rales/rhonchi. Gastrointestinal: Soft and tender over the bladder. No distention. No CVA tenderness noted. Pelvic: Normal female anatomy. Mild labial swelling R>L but no palpable cyst or abscess noted. ____________________________________________   LABS Urinalysis    Component Value Date/Time   COLORURINE YELLOW (A) 05/30/2018 1506   APPEARANCEUR CLEAR (A) 05/30/2018 1506    APPEARANCEUR Hazy 10/14/2014 1339   LABSPEC 1.027 05/30/2018 1506   LABSPEC 1.024 10/14/2014 1339   PHURINE 6.0 05/30/2018 1506   GLUCOSEU NEGATIVE 05/30/2018 1506   GLUCOSEU Negative 10/14/2014 1339   HGBUR NEGATIVE 05/30/2018 1506   BILIRUBINUR NEGATIVE 05/30/2018 1506   BILIRUBINUR Negative 10/14/2014 1339   KETONESUR NEGATIVE 05/30/2018 1506   PROTEINUR NEGATIVE 05/30/2018 1506   NITRITE NEGATIVE 05/30/2018 1506   LEUKOCYTESUR NEGATIVE 05/30/2018 1506   LEUKOCYTESUR Negative 10/14/2014 1339     ____________________________________________   INITIAL IMPRESSION / ASSESSMENT AND PLAN / ED COURSE  Suprapubic Pain, Labial Swelling:  DDX include labial abscess, UTI, STD She refuses STD testing today Urinalysis negative No evidence of abscess but given swelling/tenderness of right labia will cover with oral abx RX provided for Septra DS 1 tab PO BID x 7 days Warm compresses as needed for comfort RX provided for Ibuprofen 00 mg as needed for pain and inflammation   FINAL CLINICAL IMPRESSION(S) / ED DIAGNOSES  Final diagnoses:  Labial swelling  Suprapubic pain      Lorre Munroe, NP 05/30/18 1526    Sharman Cheek, MD 05/31/18 1924

## 2018-05-30 NOTE — ED Triage Notes (Signed)
Cyst R of vagina x 2 months, getting bigger.

## 2018-05-30 NOTE — Discharge Instructions (Addendum)
You have been diagnosed with labial swelling and suprapubic pain.  Your urinalysis did not show any evidence of infection.  You did not have a cyst or abscess that was drainable.  I have provided you with a prescription for antibiotics to take twice daily for the next 7 days and ibuprofen to take every 8 hours as needed for pain and inflammation.  [facility] with [facility] as needed if symptoms persist or worsen.

## 2018-05-30 NOTE — ED Notes (Signed)
Pt states hx of bartholin cyst which has returned and is having pain with it.

## 2018-08-01 IMAGING — CT CT ABD-PELV W/ CM
2 of 4 series · 16 of 46 positions shown, 18 images · IV contrast (APPLIED)
Comparison: Pelvic ultrasound earlier this day.

CLINICAL DATA: Left lower quadrant pain.

EXAM:
CT ABDOMEN AND PELVIS WITH CONTRAST
TECHNIQUE: Multidetector CT imaging of the abdomen and pelvis was performed
using the standard protocol following bolus administration of
intravenous contrast.
CONTRAST:  100mL [id number] IOPAMIDOL ([id number]) INJECTION 61%

[Series 2: routine abd/pel with · axial · 0.71mm/px · z∈[-1105,-670]mm · 13 of 95 slices shown, 15 images]
[im 4/95  soft-tissue]
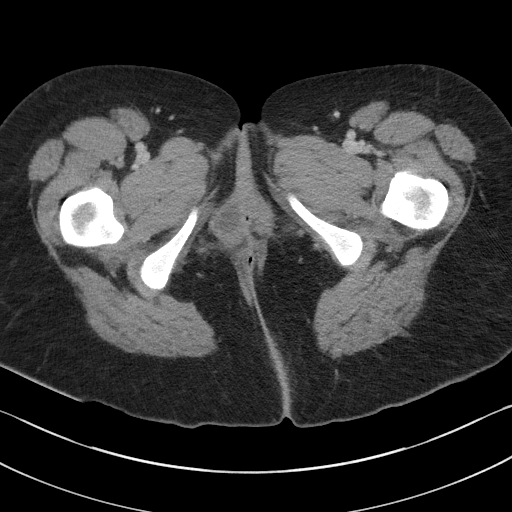
[im 4/95  bone]
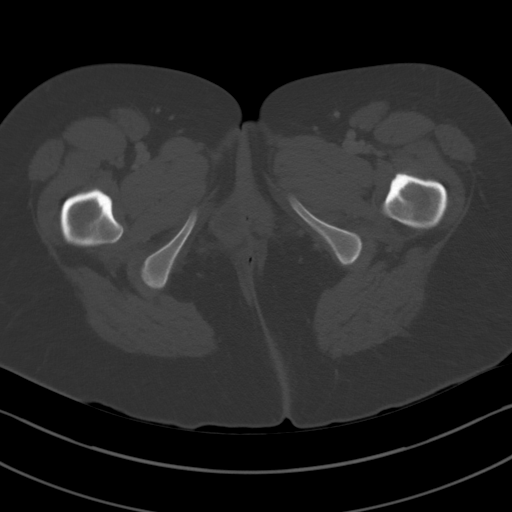
[im 12/95  soft-tissue]
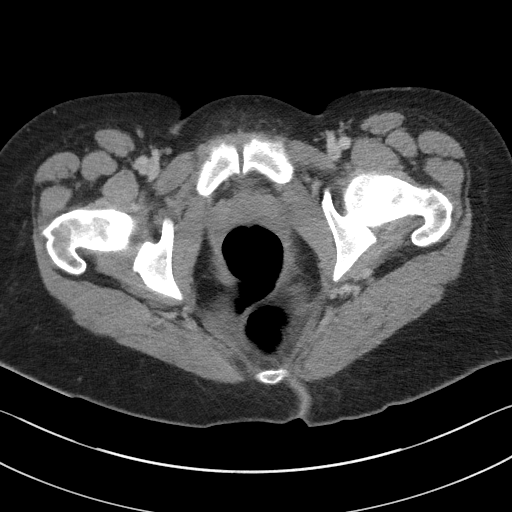
[im 19/95  soft-tissue]
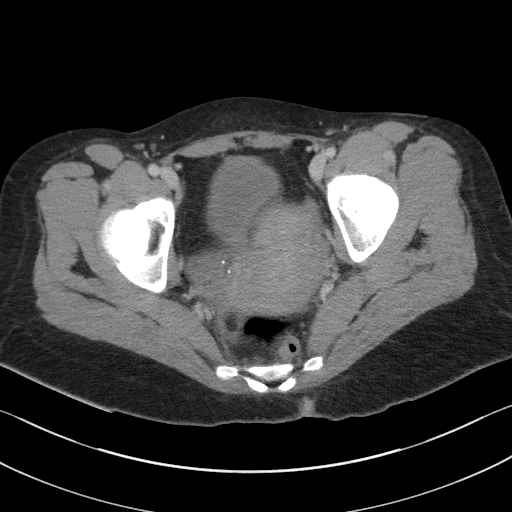
[im 27/95  soft-tissue]
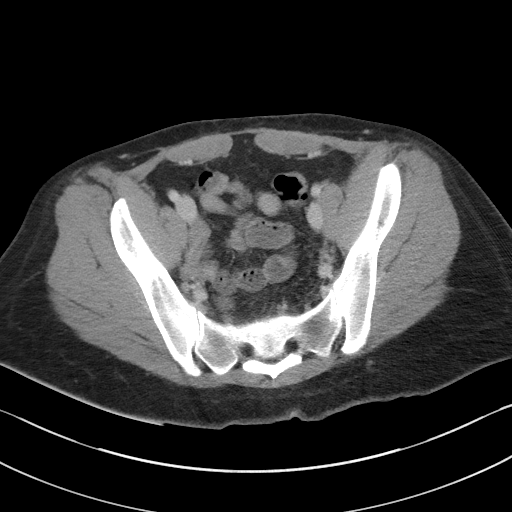
[im 34/95  soft-tissue]
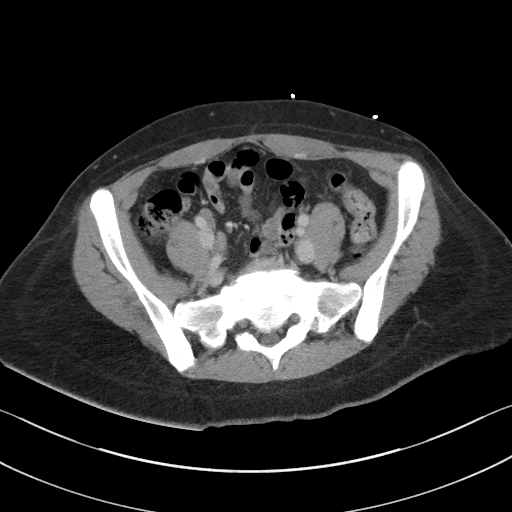
[im 42/95  soft-tissue]
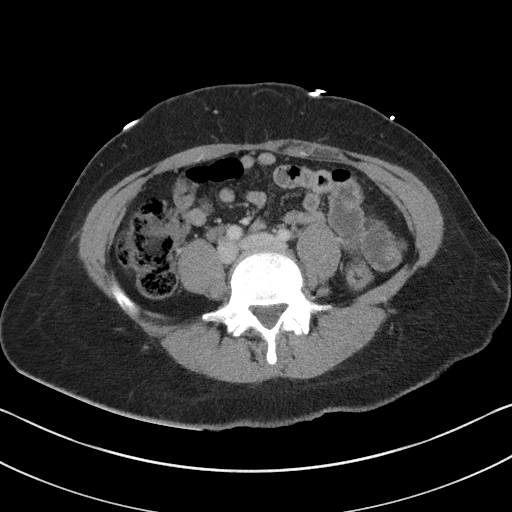
[im 49/95  soft-tissue]
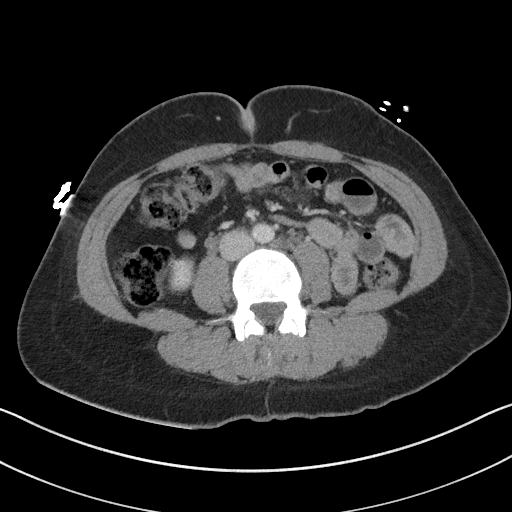
[im 53/95  soft-tissue]
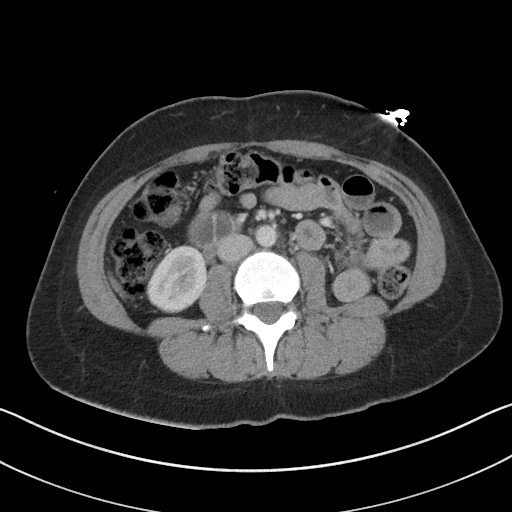
[im 61/95  soft-tissue]
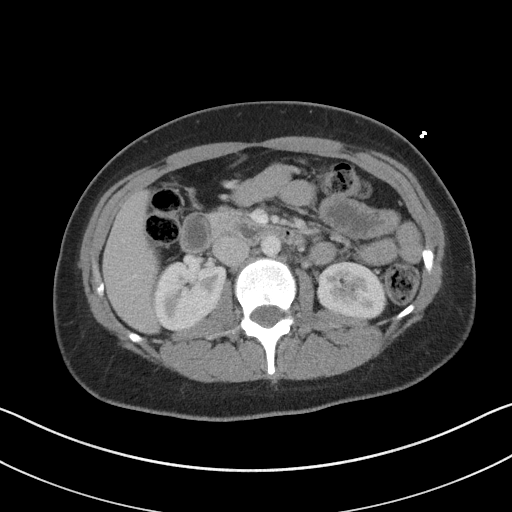
[im 61/95  bone]
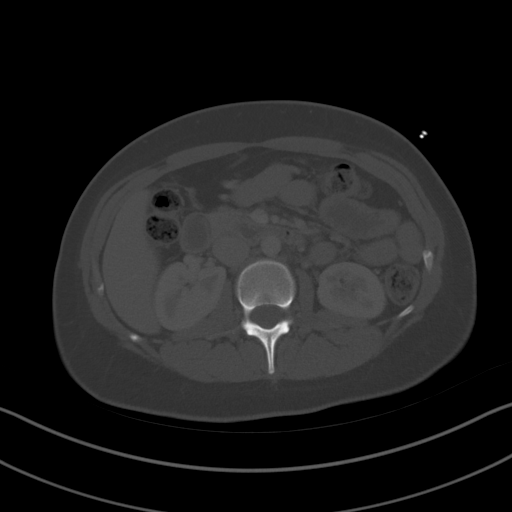
[im 68/95  soft-tissue]
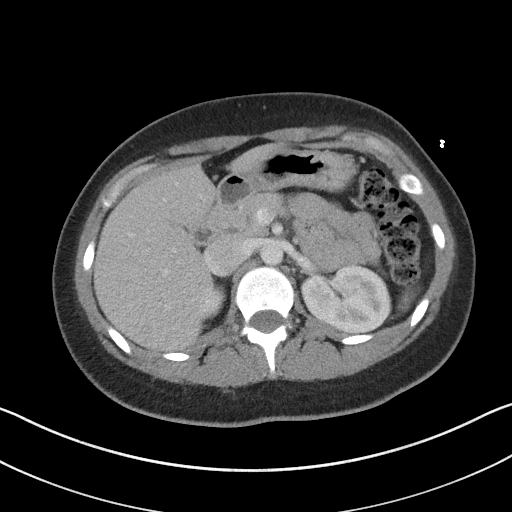
[im 76/95  soft-tissue]
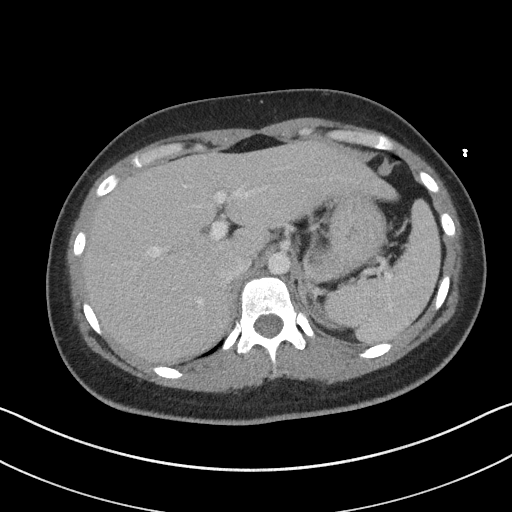
[im 83/95  soft-tissue]
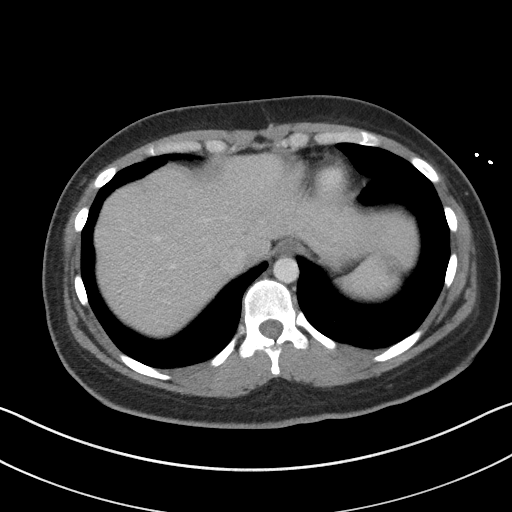
[im 91/95  soft-tissue]
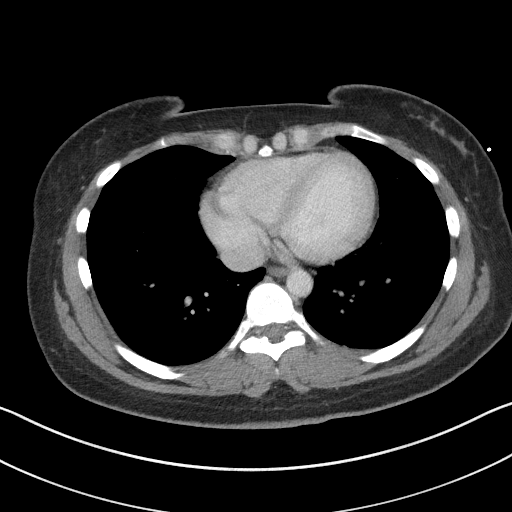

[Series 5: coronal st · coronal · 0.68mm/px · 3 of 75 slices shown]
[im 25/75  soft-tissue]
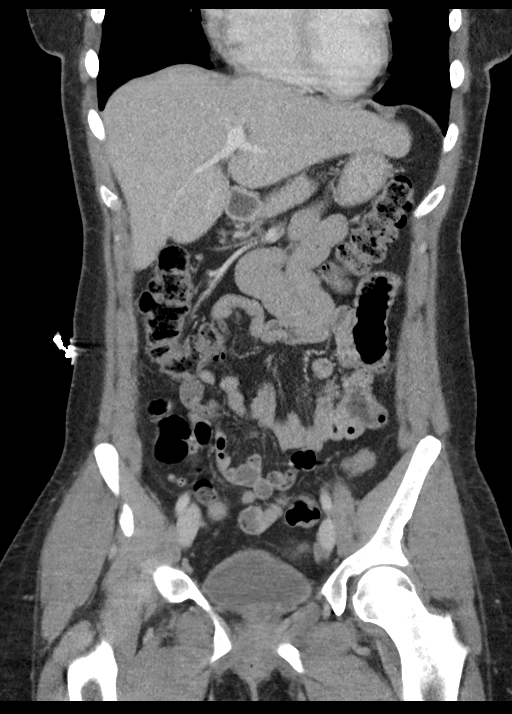
[im 33/75  soft-tissue]
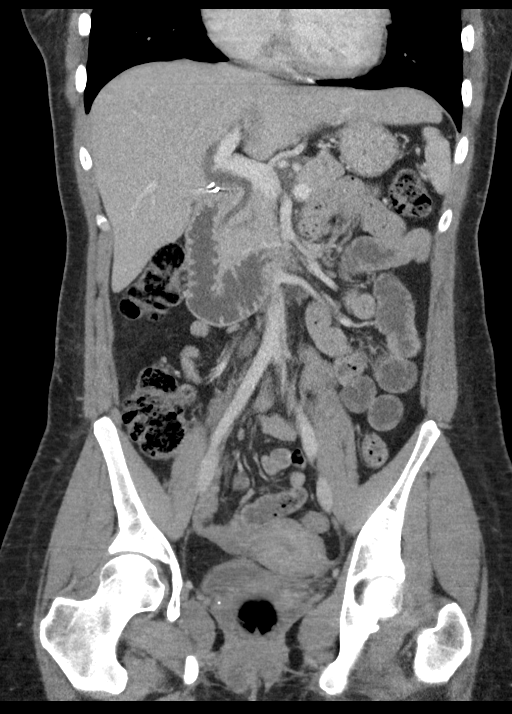
[im 42/75  soft-tissue]
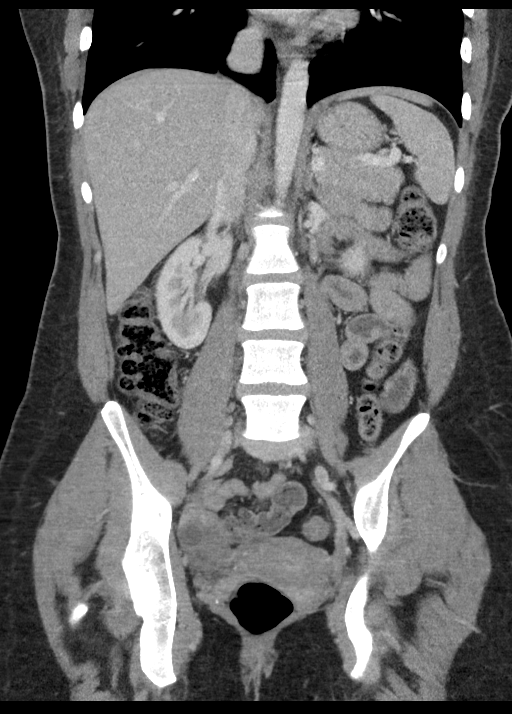

[16 of 46 positions shown; findings below may reference images not displayed]

FINDINGS: Lower chest: The lung bases are clear.

Hepatobiliary: No focal liver abnormality is seen. Status post
cholecystectomy. No biliary dilatation.

Pancreas: No ductal dilatation or inflammation.

Spleen: Normal in size without focal abnormality.

Adrenals/Urinary Tract: Adrenal glands are unremarkable. Kidneys are
normal, without renal calculi, focal lesion, or hydronephrosis.
Bladder is unremarkable.

Stomach/Bowel: Stomach is within normal limits. Appendix appears
normal. No evidence of bowel wall thickening, distention, or
inflammatory changes. Moderate colonic stool burden. Mild
fecalization of distal small bowel contents.

Vascular/Lymphatic: No significant vascular findings are present. No
enlarged abdominal or pelvic lymph nodes.

Reproductive: Right ovarian cyst as seen on ultrasound. The uterus
is unremarkable. Left ovary is tentatively but not confidently
visualized on CT. No adnexal mass.

Other: No free air, free fluid, or intra-abdominal fluid collection.

Musculoskeletal: There are no acute or suspicious osseous
abnormalities.
IMPRESSION: 1. No acute abnormality.
2. Moderate colonic stool burden and fecalization of distal small
bowel contents which can be seen with constipation. There is
otherwise no explanation for left lower quadrant pain.

## 2020-02-15 ENCOUNTER — Other Ambulatory Visit: Payer: Self-pay

## 2020-02-15 ENCOUNTER — Emergency Department: Payer: Medicaid Other

## 2020-02-15 ENCOUNTER — Encounter: Payer: Self-pay | Admitting: Emergency Medicine

## 2020-02-15 ENCOUNTER — Emergency Department
Admission: EM | Admit: 2020-02-15 | Discharge: 2020-02-15 | Disposition: A | Discharge status: Home / Self Care | Payer: Medicaid Other | Attending: Emergency Medicine | Admitting: Emergency Medicine

## 2020-02-15 DIAGNOSIS — N83201 Unspecified ovarian cyst, right side: Secondary | ICD-10-CM | POA: Insufficient documentation

## 2020-02-15 DIAGNOSIS — F17210 Nicotine dependence, cigarettes, uncomplicated: Secondary | ICD-10-CM | POA: Insufficient documentation

## 2020-02-15 DIAGNOSIS — N730 Acute parametritis and pelvic cellulitis: Secondary | ICD-10-CM | POA: Insufficient documentation

## 2020-02-15 DIAGNOSIS — R1031 Right lower quadrant pain: Secondary | ICD-10-CM | POA: Diagnosis present

## 2020-02-15 LAB — COMPREHENSIVE METABOLIC PANEL
ALT: 17 U/L (ref 0–44)
AST: 20 U/L (ref 15–41)
Albumin: 3.8 g/dL (ref 3.5–5.0)
Alkaline Phosphatase: 66 U/L (ref 38–126)
Anion gap: 6 (ref 5–15)
BUN: 13 mg/dL (ref 6–20)
CO2: 26 mmol/L (ref 22–32)
Calcium: 8.5 mg/dL — ABNORMAL LOW (ref 8.9–10.3)
Chloride: 107 mmol/L (ref 98–111)
Creatinine, Ser: 0.89 mg/dL (ref 0.44–1.00)
GFR calc Af Amer: >60 mL/min (ref >60)
GFR calc non Af Amer: >60 mL/min (ref >60)
Glucose, Bld: 94 mg/dL (ref 70–99)
Potassium: 3.7 mmol/L (ref 3.5–5.1)
Sodium: 139 mmol/L (ref 135–145)
Total Bilirubin: 0.5 mg/dL (ref 0.3–1.2)
Total Protein: 7.1 g/dL (ref 6.5–8.1)

## 2020-02-15 LAB — CBC
HCT: 41.6 % (ref 36.0–46.0)
Hemoglobin: 13.2 g/dL (ref 12.0–15.0)
MCH: 29.1 pg (ref 26.0–34.0)
MCHC: 31.7 g/dL (ref 30.0–36.0)
MCV: 91.8 fL (ref 80.0–100.0)
Platelets: 322 10*3/uL (ref 150–400)
RBC: 4.53 MIL/uL (ref 3.87–5.11)
RDW: 13.3 % (ref 11.5–15.5)
WBC: 8.4 10*3/uL (ref 4.0–10.5)
nRBC: 0 % (ref 0.0–0.2)

## 2020-02-15 LAB — URINALYSIS, COMPLETE (UACMP) WITH MICROSCOPIC
Bilirubin Urine: NEGATIVE
Glucose, UA: NEGATIVE mg/dL
Hgb urine dipstick: NEGATIVE
Ketones, ur: NEGATIVE mg/dL
Nitrite: NEGATIVE
Protein, ur: NEGATIVE mg/dL
Specific Gravity, Urine: 1.024 (ref 1.005–1.030)
pH: 6 (ref 5.0–8.0)

## 2020-02-15 LAB — WET PREP, GENITAL
Sperm: NONE SEEN
Yeast Wet Prep HPF POC: NONE SEEN

## 2020-02-15 LAB — LIPASE, BLOOD: Lipase: 24 U/L (ref 11–51)

## 2020-02-15 LAB — CHLAMYDIA/NGC RT PCR (ARMC ONLY)
Chlamydia Tr: NOT DETECTED
N gonorrhoeae: NOT DETECTED

## 2020-02-15 LAB — POCT PREGNANCY, URINE: Preg Test, Ur: NEGATIVE

## 2020-02-15 MED ORDER — LIDOCAINE HCL (PF) 1 % IJ SOLN
5.0000 mL | Freq: Once | INTRAMUSCULAR | Status: AC
  Administered 2020-02-15: 5 mL
  Filled 2020-02-15: qty 5

## 2020-02-15 MED ORDER — METRONIDAZOLE 500 MG PO TABS: 500.0000 mg | ORAL_TABLET | Freq: Two times a day (BID) | ORAL | 0 refills | Status: AC

## 2020-02-15 MED ORDER — ACETAMINOPHEN 500 MG PO TABS
1000.0000 mg | ORAL_TABLET | Freq: Once | ORAL | Status: AC
  Administered 2020-02-15: 1000 mg via ORAL
  Filled 2020-02-15: qty 2

## 2020-02-15 MED ORDER — IOHEXOL 300 MG/ML  SOLN
100.0000 mL | Freq: Once | INTRAMUSCULAR | Status: AC | PRN
  Administered 2020-02-15: 100 mL via INTRAVENOUS
  Filled 2020-02-15: qty 100

## 2020-02-15 MED ORDER — KETOROLAC TROMETHAMINE 30 MG/ML IJ SOLN
15.0000 mg | Freq: Once | INTRAMUSCULAR | Status: AC
  Administered 2020-02-15: 15 mg via INTRAVENOUS
  Filled 2020-02-15: qty 1

## 2020-02-15 MED ORDER — DOXYCYCLINE MONOHYDRATE 100 MG PO TABS: 100.0000 mg | ORAL_TABLET | Freq: Two times a day (BID) | ORAL | 0 refills | Status: AC

## 2020-02-15 MED ORDER — CEFTRIAXONE SODIUM 1 G IJ SOLR
500.0000 mg | Freq: Once | INTRAMUSCULAR | Status: AC
  Administered 2020-02-15: 500 mg via INTRAMUSCULAR
  Filled 2020-02-15: qty 10

## 2020-02-15 NOTE — ED Triage Notes (Signed)
First nurse note- sent from PCP for r/o appendicitis, torsion, PID. Ambulatory, NAD

## 2020-02-15 NOTE — ED Triage Notes (Signed)
Patient to ER for c/o pain to RLQ since Sunday. Denies N/V/D or fevers.

## 2020-02-15 NOTE — ED Provider Notes (Signed)
[facility] Emergency Department Provider Note  ____________________________________________   First MD Initiated Contact with Patient 02/15/20 1319     (approximate)  I have reviewed the triage vital signs and the nursing notes.   HISTORY  Chief Complaint Abdominal Pain    HPI Tracy Glover is a 29 y.o. female with prior cholecystectomy, C-section status post bilateral tubal ligation, prior PID, ovarian cyst removal who comes in with right lower quadrant pain.  Patient reports having pain for the past 2 days, constant, nothing makes it better including ibuprofen this morning, nothing makes it worse.  Denies any vomiting, fevers, dysuria.  Patient is sexually active but denies any vaginal discharge.  She denies new sexual partner.  She denies having her appendix removed.  Denies any chest pain, shortness of breath.          Past Medical History:  Diagnosis Date  . Cholecystitis   . Hx of gallstones 2016    Patient Active Problem List   Diagnosis Date Noted  . Postpartum care following cesarean delivery 03/24/2016    Past Surgical History:  Procedure Laterality Date  . CESAREAN SECTION    . CESAREAN SECTION WITH BILATERAL TUBAL LIGATION N/A 05/07/2016   Procedure: CESAREAN SECTION WITH RIGHT TUBAL LIGATION;  Surgeon: Vena Austria, MD;  Location: [facility] ORS;  Service: Obstetrics;  Laterality: N/A;  . CHOLECYSTECTOMY N/A 05/19/2015   Procedure: LAPAROSCOPIC CHOLECYSTECTOMY;  Surgeon: Natale Lay, MD;  Location: [facility] ORS;  Service: General;  Laterality: N/A;    Prior to Admission medications   Medication Sig Start Date End Date Taking? Authorizing Provider  ibuprofen (ADVIL,MOTRIN) 600 MG tablet Take 1 tablet (600 mg total) by mouth every 8 (eight) hours as needed. 05/30/18   Lorre Munroe, NP    Allergies Patient has no known allergies.  Family History  Problem Relation Age of Onset  . Cancer Mother   . Kidney disease Father    Currently on Dialysis    Social History Social History   Tobacco Use  . Smoking status: Current Some Day Smoker    Types: Cigarettes  . Smokeless tobacco: Never Used  Substance Use Topics  . Alcohol use: Yes    Comment: occ.  . 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 ENT: No sore throat. Cardiovascular: Denies chest pain. Respiratory: Denies shortness of breath. Gastrointestinal: Positive right lower quadrant tenderness no nausea, no vomiting.  No diarrhea.  No constipation. Genitourinary: Negative for dysuria. Musculoskeletal: Negative for back pain. Skin: Negative for rash. Neurological: Negative for headaches, focal weakness or numbness. All other ROS negative ____________________________________________   PHYSICAL EXAM:  VITAL SIGNS: ED Triage Vitals  Enc Vitals Group     BP 02/15/20 1151 (!) 154/71     Pulse Rate 02/15/20 1151 72     Resp 02/15/20 1151 18     Temp 02/15/20 1151 97.9 F (36.6 C)     Temp src --      SpO2 02/15/20 1151 100 %     Weight 02/15/20 1151 206 lb (93.4 kg)     Height 02/15/20 1151 5\' 6"  (1.676 m)     Head Circumference --      Peak Flow --      Pain Score 02/15/20 1203 6     Pain  Loc --      Pain Edu? --      Excl. in GC? --     Constitutional: Alert and oriented. Well appearing and in no acute distress. Eyes: Conjunctivae are normal. EOMI. Head: Atraumatic. Nose: No congestion/rhinnorhea. Mouth/Throat: Mucous membranes are moist.   Neck: No stridor. Trachea Midline. FROM Cardiovascular: Normal rate, regular rhythm. Grossly normal heart sounds.  Good peripheral circulation. Respiratory: Normal respiratory effort.  No retractions. Lungs CTAB. Gastrointestinal: Soft but tender in the right lower quadrant no distention. No abdominal bruits.  Musculoskeletal: No lower  extremity tenderness nor edema.  No joint effusions. Neurologic:  Normal speech and language. No gross focal neurologic deficits are appreciated.  Skin:  Skin is warm, dry and intact. No rash noted. Psychiatric: Mood and affect are normal. Speech and behavior are normal. GU: Tenderness in the right lower quadrant, some discharge noted.  No cervical motion tenderness  ____________________________________________   LABS (all labs ordered are listed, but only abnormal results are displayed)  Labs Reviewed  WET PREP, GENITAL - Abnormal; Notable for the following components:      Result Value   Trich, Wet Prep PRESENT (*)    Clue Cells Wet Prep HPF POC PRESENT (*)    WBC, Wet Prep HPF POC FEW (*)    All other components within normal limits  COMPREHENSIVE METABOLIC PANEL - Abnormal; Notable for the following components:   Calcium 8.5 (*)    All other components within normal limits  URINALYSIS, COMPLETE (UACMP) WITH MICROSCOPIC - Abnormal; Notable for the following components:   Color, Urine YELLOW (*)    APPearance HAZY (*)    Leukocytes,Ua TRACE (*)    Bacteria, UA RARE (*)    All other components within normal limits  CHLAMYDIA/NGC RT PCR ([facility] ONLY)  LIPASE, BLOOD  CBC  POC URINE PREG, ED  POCT PREGNANCY, URINE   ____________________________________________   RADIOLOGY   Official radiology report(s): CT ABDOMEN PELVIS W CONTRAST  Result Date: 02/15/2020 CLINICAL DATA:  Right lower quadrant pain.  Rule out appendicitis. EXAM: CT ABDOMEN AND PELVIS WITH CONTRAST TECHNIQUE: Multidetector CT imaging of the abdomen and pelvis was performed using the standard protocol following bolus administration of intravenous contrast. CONTRAST:  OMNIPAQUE IOHEXOL 300 MG/ML  SOLN COMPARISON:  Pelvic ultrasound earlier this day. Abdominal CT 05/04/2017 FINDINGS: Lower chest: The lung bases are clear.  Choose 2 Hepatobiliary: No focal liver abnormality is seen. Status post  cholecystectomy. No biliary dilatation. Pancreas: No ductal dilatation or inflammation. Spleen: Normal in size without focal abnormality. Adrenals/Urinary Tract: Normal adrenal glands. No hydronephrosis. No perinephric edema. Homogeneous renal enhancement. Urinary bladder is unremarkable. Stomach/Bowel: No appendicitis. Appendix is normal in caliber and contains small amount of intraluminal contrast from remote prior radiologic exam. No periappendiceal fat stranding. Terminal ileum is normal. No bowel obstruction or inflammatory change. Small volume of colonic stool. Vascular/Lymphatic: Small retroperitoneal nodes are not enlarged by size criteria. Acute vascular findings. Abdominal aorta normal in caliber. The portal vein is patent. Reproductive: Complex right ovarian cyst which was assessed on ultrasound earlier today. Uterus is unremarkable. Other: Small amount of free fluid in the pelvis may be physiologic. No upper abdominal free fluid. No free air or intra-abdominal abscess. Musculoskeletal: There are no acute or suspicious osseous abnormalities. IMPRESSION: 1. No acute abnormality in the abdomen/pelvis. Normal appendix. 2. Complex right ovarian cyst which was assessed on ultrasound earlier today. Small amount of free fluid in the pelvis may be physiologic. Electronically Signed  By: Narda Rutherford M.D.   On: 02/15/2020 17:35   US PELVIC COMPLETE W TRANSVAGINAL AND TORSION R/O  Result Date: 02/15/2020 CLINICAL DATA:  Right lower quadrant pain x3 days. EXAM: TRANSABDOMINAL AND TRANSVAGINAL ULTRASOUND OF PELVIS DOPPLER ULTRASOUND OF OVARIES TECHNIQUE: Both transabdominal and transvaginal ultrasound examinations of the pelvis were performed. Transabdominal technique was performed for global imaging of the pelvis including uterus, ovaries, adnexal regions, and pelvic cul-de-sac. It was necessary to proceed with endovaginal exam following the transabdominal exam to visualize the endometrium and bilateral  ovaries. Color and duplex Doppler ultrasound was utilized to evaluate blood flow to the ovaries. COMPARISON:  None. FINDINGS: Uterus Measurements: A 0.9 cm x 4.4 cm x 4.8 cm = volume: 99 mL. No fibroids or other mass visualized. Endometrium Thickness: 3.4 mm.  No focal abnormality visualized. Right ovary Measurements: 5.2 cm x 3.5 cm x 3.7 cm = volume: 35 mL. A 3.5 cm x 3.4 cm x 3.3 cm complex anechoic structure is seen within the right ovary. Left ovary Measurements: 2.5 cm x 1.4 cm x 1.9 cm = volume: 3.5 mL. Normal appearance/no adnexal mass. Pulsed Doppler evaluation of both ovaries demonstrates normal low-resistance arterial and venous waveforms. Other findings No abnormal free fluid. IMPRESSION: Complex right ovarian cyst. Electronically Signed   By: Aram Candela M.D.   On: 02/15/2020 16:22    ____________________________________________   PROCEDURES  Procedure(s) performed (including Critical Care):  Procedures   ____________________________________________   INITIAL IMPRESSION / ASSESSMENT AND PLAN / ED COURSE  Tracy Glover was evaluated in Emergency Department on 02/15/2020 for the symptoms described in the history of present illness. She was evaluated in the context of the global COVID-19 pandemic, which necessitated consideration that the patient might be at risk for infection with the SARS-CoV-2 virus that causes COVID-19. Institutional protocols and algorithms that pertain to the evaluation of patients at risk for COVID-19 are in a state of rapid change based on information released by regulatory bodies including the CDC and federal and state organizations. These policies and algorithms were followed during the patient's care in the ED.    Patient is a 29 year old who comes in with right lower quadrant tenderness in the setting of history of ovarian cyst, PID.  Patient denies any vaginal discharge but given the discomfort in her right lower quadrant will get ultrasound  evaluate for cyst, torsion.  Will get pregnancy test evaluate for ectopic.  Patient states of a little bit of right upper quadrant tenderness as well but she did have a gallbladder removed but will get LFTs and lipase to evaluate for retained stone.   Pregnancy test negative  Ultrasound shows complex right ovarian cyst.  Reviewed patient's prior ultrasounds and she is typically had a cyst on the right side previously.  Patient does not have an OB to follow-up with.  Patient reports having pain during the ultrasound so do not think this is intermittent torsion however I did discuss the case with Dr. Tiburcio Pea who will follow her up tomorrow in clinic at [facility] at [facility] which patient is aware of.  Her pelvic exam did show a little bit of discharge in right pelvic pain.  I discussed with patient that given her history of PID in the past and that she was trichomonas positive we could treat her prophylactically for PID in case it was recurrent PID.  We also discussed doing a CT scan just to make sure there is no evidence of appendicitis given patient continues to  have pain in the right lower quadrant.  CT scan without evidence of appendicitis.  Patient feels comfortable doing treatment for PID and understands follow-up with Dr. Kenton Kingfisher tomorrow.  Patient understands not to drink alcohol while on the Flagyl.  I discussed the provisional nature of ED diagnosis, the treatment so far, the ongoing plan of care, follow up appointments and return precautions with the patient and any family or support people present. They expressed understanding and agreed with the plan, discharged home.      ____________________________________________   FINAL CLINICAL IMPRESSION(S) / ED DIAGNOSES   Final diagnoses:  PID (acute pelvic inflammatory disease)  Cyst of right ovary      MEDICATIONS GIVEN DURING THIS VISIT:  Medications  cefTRIAXone (ROCEPHIN) injection 500 mg (has no administration in time range)    ketorolac (TORADOL) 30 MG/ML injection 15 mg (has no administration in time range)  lidocaine (PF) (XYLOCAINE) 1 % injection 5 mL (has no administration in time range)  acetaminophen (TYLENOL) tablet 1,000 mg (1,000 mg Oral Given 02/15/20 1355)  iohexol (OMNIPAQUE) 300 MG/ML solution 100 mL (100 mLs Intravenous Contrast Given 02/15/20 1709)     ED Discharge Orders         Ordered    metroNIDAZOLE (FLAGYL) 500 MG tablet  2 times daily     02/15/20 1750    doxycycline (ADOXA) 100 MG tablet  2 times daily     02/15/20 1750           Note:  This document was prepared using Dragon voice recognition software and may include unintentional dictation errors.   Vanessa [place], MD 02/15/20 [phone]

## 2020-02-15 NOTE — ED Notes (Signed)
See triage note  Presents with right lower abd pain and lateral rib pain  States pain started on Sunday    States she has gotten min relief with tylenol   No fever,n/v/d

## 2020-02-15 NOTE — Discharge Instructions (Addendum)
Positive for Bacterial vaginosis and trichomonas   No sexual activity for 2 weeks-have partner get tested for disease as well.   Follow up with Dr. Tiburcio Pea TOMORROW at 1:15 -address above.   Return to ER for other concerns.

## 2020-02-21 ENCOUNTER — Encounter: Payer: Self-pay | Admitting: Obstetrics and Gynecology

## 2020-02-21 ENCOUNTER — Other Ambulatory Visit: Payer: Self-pay

## 2020-02-21 ENCOUNTER — Other Ambulatory Visit (HOSPITAL_COMMUNITY)
Admission: RE | Admit: 2020-02-21 | Discharge: 2020-02-21 | Disposition: A | Discharge status: Home / Self Care | Payer: Medicaid Other | Source: Ambulatory Visit | Attending: Obstetrics and Gynecology | Admitting: Obstetrics and Gynecology

## 2020-02-21 ENCOUNTER — Ambulatory Visit (INDEPENDENT_AMBULATORY_CARE_PROVIDER_SITE_OTHER): Payer: Medicaid Other | Admitting: Obstetrics and Gynecology

## 2020-02-21 VITALS — BP 128/72 | Ht 65.0 in | Wt 208.2 lb

## 2020-02-21 DIAGNOSIS — Y929 Unspecified place or not applicable: Secondary | ICD-10-CM | POA: Insufficient documentation

## 2020-02-21 DIAGNOSIS — N83299 Other ovarian cyst, unspecified side: Secondary | ICD-10-CM | POA: Diagnosis not present

## 2020-02-21 DIAGNOSIS — R399 Unspecified symptoms and signs involving the genitourinary system: Secondary | ICD-10-CM

## 2020-02-21 DIAGNOSIS — Y999 Unspecified external cause status: Secondary | ICD-10-CM | POA: Insufficient documentation

## 2020-02-21 DIAGNOSIS — X58XXXA Exposure to other specified factors, initial encounter: Secondary | ICD-10-CM | POA: Insufficient documentation

## 2020-02-21 DIAGNOSIS — R109 Unspecified abdominal pain: Secondary | ICD-10-CM | POA: Diagnosis not present

## 2020-02-21 DIAGNOSIS — R1084 Generalized abdominal pain: Secondary | ICD-10-CM | POA: Diagnosis not present

## 2020-02-21 DIAGNOSIS — Y9389 Activity, other specified: Secondary | ICD-10-CM | POA: Insufficient documentation

## 2020-02-21 DIAGNOSIS — F17210 Nicotine dependence, cigarettes, uncomplicated: Secondary | ICD-10-CM | POA: Insufficient documentation

## 2020-02-21 DIAGNOSIS — Z124 Encounter for screening for malignant neoplasm of cervix: Secondary | ICD-10-CM | POA: Diagnosis not present

## 2020-02-21 DIAGNOSIS — S339XXA Sprain of unspecified parts of lumbar spine and pelvis, initial encounter: Secondary | ICD-10-CM | POA: Insufficient documentation

## 2020-02-21 DIAGNOSIS — Z0000 Encounter for general adult medical examination without abnormal findings: Secondary | ICD-10-CM | POA: Diagnosis not present

## 2020-02-21 DIAGNOSIS — N83291 Other ovarian cyst, right side: Secondary | ICD-10-CM | POA: Diagnosis not present

## 2020-02-21 DIAGNOSIS — S3992XA Unspecified injury of lower back, initial encounter: Secondary | ICD-10-CM | POA: Diagnosis present

## 2020-02-21 LAB — POCT URINALYSIS DIPSTICK
Bilirubin, UA: NEGATIVE
Blood, UA: NEGATIVE
Glucose, UA: NEGATIVE
Leukocytes, UA: NEGATIVE
Nitrite, UA: NEGATIVE
Protein, UA: NEGATIVE
Spec Grav, UA: 1.02 (ref 1.010–1.025)
Urobilinogen, UA: 0.2 E.U./dL
pH, UA: 5 (ref 5.0–8.0)

## 2020-02-21 NOTE — ED Triage Notes (Signed)
Pt arrives to ED via POV from home with c/o right-sided flank and bilateral lower  back pain since Saturday. Pt states she was seen here recently for same and referred to "[facility]" but states she was sent back here for continued flank pain (?). Pt denies chance of pregnancy; reports h/x of ovarian cysts. Pt denies any urinary changes or c/o's. Pt is A&O, in NAD; RR even, regular, and unlabored.

## 2020-02-21 NOTE — Progress Notes (Signed)
Patient ID: Tracy Glover, female   DOB: [birth year], 29 y.o.   MRN: [id number]  Reason for Consult: follow up (ER)   Referred by [facility]  Subjective:     HPI:  Tracy Glover is a 29 y.o. female she presents today for follow-up in the ER of a complex ovarian cyst.  She reports that her right-sided pain had been feeling better but then suddenly 2 days ago she began having severe new onset back pain.  She reports that she tried an icy hot patch which did not relieve her pain.  She reports that she also took an 800 mg pill from a friend and that provided some relief.  She is unsure if this pill was Motrin or ibuprofen or another medicine.  She denies any pain with urination.  She denies any hematuria.  She says that nothing helps the pain or makes it worse.  She has been having difficulty walking because of the pain and is walking bent over.  She denies any fever or change to her bowel movements at home.  She reports that she has been taking the doxycycline and Flagyl as it was prescribed at the hospital.   Past Medical History:  Diagnosis Date  . Cholecystitis   . Hx of gallstones 2016   Family History  Problem Relation Age of Onset  . Cancer Mother   . Kidney disease Father        Currently on Dialysis   Past Surgical History:  Procedure Laterality Date  . CESAREAN SECTION    . CESAREAN SECTION WITH BILATERAL TUBAL LIGATION N/A 05/07/2016   Procedure: CESAREAN SECTION WITH RIGHT TUBAL LIGATION;  Surgeon: Malachy Mood, MD;  Location: [facility] ORS;  Service: Obstetrics;  Laterality: N/A;  . CHOLECYSTECTOMY N/A 05/19/2015   Procedure: LAPAROSCOPIC CHOLECYSTECTOMY;  Surgeon: Sherri Rad, MD;  Location: [facility] ORS;  Service: General;  Laterality: N/A;    Short Social History:  Social History   Tobacco Use  . Smoking status: Current Some Day Smoker    Types: Cigarettes  . Smokeless tobacco: Never Used  Substance Use Topics  . Alcohol use: Yes    Comment: occ.     No Known Allergies  Current Outpatient Medications  Medication Sig Dispense Refill  . doxycycline (ADOXA) 100 MG tablet Take 1 tablet (100 mg total) by mouth 2 (two) times daily for 14 days. 28 tablet 0  . ibuprofen (ADVIL,MOTRIN) 600 MG tablet Take 1 tablet (600 mg total) by mouth every 8 (eight) hours as needed. 15 tablet 0  . metroNIDAZOLE (FLAGYL) 500 MG tablet Take 1 tablet (500 mg total) by mouth 2 (two) times daily for 14 days. 28 tablet 0   No current facility-administered medications for this visit.    Review of Systems  Constitutional: Negative for chills, fatigue, fever and unexpected weight change.  HENT: Negative for trouble swallowing.  Eyes: Negative for loss of vision.  Respiratory: Negative for cough, shortness of breath and wheezing.  Cardiovascular: Negative for chest pain, leg swelling, palpitations and syncope.  GI: Positive for abdominal pain. Negative for blood in stool, diarrhea, nausea and vomiting.  GU: Negative for difficulty urinating, dysuria, frequency and hematuria.  Musculoskeletal: Positive for back pain. Negative for leg pain and joint pain.  Skin: Negative for rash.  Neurological: Negative for dizziness, headaches, light-headedness, numbness and seizures.  Psychiatric: Negative for behavioral problem, confusion, depressed mood and sleep disturbance.        Objective:  Objective  Vitals:   02/21/20 1359  BP: 128/72  Weight: 208 lb 3.2 oz (94.4 kg)  Height: 5\' 5"  (1.651 m)   Body mass index is 34.65 kg/m.  Physical Exam Vitals and nursing note reviewed.  Constitutional:      Appearance: She is well-developed.  HENT:     Head: Normocephalic and atraumatic.  Eyes:     Pupils: Pupils are equal, round, and reactive to light.  Cardiovascular:     Rate and Rhythm: Normal rate and regular rhythm.  Pulmonary:     Effort: Pulmonary effort is normal. No respiratory distress.  Abdominal:     General: Abdomen is flat. There is no  distension.     Palpations: Abdomen is soft.  Genitourinary:    Comments: External: Normal appearing vulva. No lesions noted.  Speculum examination: Normal appearing cervix. No blood in the vaginal vault. Normal white discharge.   Bimanual examination: Exam limited by body habitus and pain. Uterus midline, tender, normal in size, shape and contour.  No CMT. Right adnexal tenderness.No adnexal masses appreciated. Pelvis not fixed.   Skin:    General: Skin is warm and dry.  Neurological:     Mental Status: She is alert and oriented to person, place, and time.  Psychiatric:        Behavior: Behavior normal.        Thought Content: Thought content normal.        Judgment: Judgment normal.        Assessment/Plan:    29 year old G4, P3 with complex right ovarian cyst 1.  New onset severe back pain x2 days.  Encouraged the patient to follow-up with the ER.  Especially given that her symptoms of tenderness with bending of the neck.  And shooting pain when she bends her neck.  Discussed risk of slipped disc or meningitis. UA normal  2. Complex right ovarian cyst.  Discussed the ultrasound images with radiologist on-call.  Radiologist recommended repeat imaging in 6 to 12 weeks and felt that septations in the ovarian cyst were thin in nature.  No enhance color flow noted in the ovarian cyst.  If cyst does not resolve consider surgical evaluation.  Unlikely that right back pain is related to ovarian cyst however repeat imaging would be warranted given severity of her symptoms.  Encourage patient to continue with taking doxycycline and Flagyl.  3. Cervical cancer screening -Pap smear performed today  4. Trichomonas - recommended patient have repeat testing for trichomonas in 1 to 3 months for test of cure.  Encouraged patient to notify her sexual partner and not have intercourse with this person until they have also been treated.  5. Provide patient with a note to stay off of work until 02/27/2020  related to her acute back pain.  More than 45 minutes were spent face to face with the patient in the room, reviewing the medical record, labs and images, and coordinating care for the patient. The plan of management was discussed in detail and counseling was provided.     02/29/2020 MD [facility], [facility] 02/21/2020 2:31 PM

## 2020-02-21 NOTE — Patient Instructions (Signed)
Ovarian Cyst An ovarian cyst is a fluid-filled sac on an ovary. The ovaries are organs that make eggs in women. Most ovarian cysts go away on their own and are not cancerous (are benign). Some cysts need treatment. Follow these instructions at home:  Take over-the-counter and prescription medicines only as told by your doctor.  Do not drive or use heavy machinery while taking prescription pain medicine.  Get pelvic exams and Pap tests as often as told by your doctor.  Return to your normal activities as told by your doctor. Ask your doctor what activities are safe for you.  Do not use any products that contain nicotine or tobacco, such as cigarettes and e-cigarettes. If you need help quitting, ask your doctor.  Keep all follow-up visits as told by your doctor. This is important. Contact a doctor if:  Your periods are: ? Late. ? Irregular. ? Painful.   Your periods stop.  You have pelvic pain that does not go away.  You have pressure on your bladder.  You have trouble making your bladder empty when you pee (urinate).  You have pain during sex.  You have any of the following in your belly (abdomen): ? A feeling of fullness. ? Pressure. ? Discomfort. ? Pain that does not go away. ? Swelling.  You feel sick most of the time.  You have trouble pooping (have constipation).  You are not as hungry as usual (you lose your appetite).  You get very bad acne.  You start to have more hair on your body and face.  You are gaining weight or losing weight without changing your exercise and eating habits.  You think you may be pregnant. Get help right away if:  You have belly pain that is very bad or gets worse.  You cannot eat or drink without throwing up (vomiting).  You suddenly get a fever.  Your period is a lot heavier than usual. This information is not intended to replace advice given to you by your health care provider. Make sure you discuss any questions you have  with your health care provider. Document Revised: 08/08/2017 Document Reviewed: 01/28/2016 Elsevier Patient Education  2020 [employer]  

## 2020-02-22 ENCOUNTER — Emergency Department: Payer: Medicaid Other

## 2020-02-22 ENCOUNTER — Emergency Department
Admission: EM | Admit: 2020-02-22 | Discharge: 2020-02-22 | Disposition: A | Discharge status: Home / Self Care | Payer: Medicaid Other | Attending: Emergency Medicine | Admitting: Emergency Medicine

## 2020-02-22 DIAGNOSIS — S335XXA Sprain of ligaments of lumbar spine, initial encounter: Secondary | ICD-10-CM

## 2020-02-22 LAB — CBC WITH DIFFERENTIAL/PLATELET
Abs Immature Granulocytes: 0.02 10*3/uL (ref 0.00–0.07)
Basophils Absolute: 0.1 10*3/uL (ref 0.0–0.1)
Basophils Relative: 1 %
Eosinophils Absolute: 0.7 10*3/uL — ABNORMAL HIGH (ref 0.0–0.5)
Eosinophils Relative: 8 %
HCT: 37.9 % (ref 36.0–46.0)
Hemoglobin: 12.4 g/dL (ref 12.0–15.0)
Immature Granulocytes: 0 %
Lymphocytes Relative: 30 %
Lymphs Abs: 2.6 10*3/uL (ref 0.7–4.0)
MCH: 29 pg (ref 26.0–34.0)
MCHC: 32.7 g/dL (ref 30.0–36.0)
MCV: 88.8 fL (ref 80.0–100.0)
Monocytes Absolute: 0.8 10*3/uL (ref 0.1–1.0)
Monocytes Relative: 9 %
Neutro Abs: 4.7 10*3/uL (ref 1.7–7.7)
Neutrophils Relative %: 52 %
Platelets: 328 10*3/uL (ref 150–400)
RBC: 4.27 MIL/uL (ref 3.87–5.11)
RDW: 13.4 % (ref 11.5–15.5)
WBC: 8.8 10*3/uL (ref 4.0–10.5)
nRBC: 0 % (ref 0.0–0.2)

## 2020-02-22 LAB — COMPREHENSIVE METABOLIC PANEL
ALT: 20 U/L (ref 0–44)
AST: 18 U/L (ref 15–41)
Albumin: 3.8 g/dL (ref 3.5–5.0)
Alkaline Phosphatase: 68 U/L (ref 38–126)
Anion gap: 10 (ref 5–15)
BUN: 18 mg/dL (ref 6–20)
CO2: 28 mmol/L (ref 22–32)
Calcium: 9.1 mg/dL (ref 8.9–10.3)
Chloride: 103 mmol/L (ref 98–111)
Creatinine, Ser: 1 mg/dL (ref 0.44–1.00)
GFR calc Af Amer: >60 mL/min (ref >60)
GFR calc non Af Amer: >60 mL/min (ref >60)
Glucose, Bld: 96 mg/dL (ref 70–99)
Potassium: 4.3 mmol/L (ref 3.5–5.1)
Sodium: 141 mmol/L (ref 135–145)
Total Bilirubin: 0.6 mg/dL (ref 0.3–1.2)
Total Protein: 6.9 g/dL (ref 6.5–8.1)

## 2020-02-22 LAB — URINALYSIS, COMPLETE (UACMP) WITH MICROSCOPIC
Bacteria, UA: NONE SEEN
Bilirubin Urine: NEGATIVE
Glucose, UA: NEGATIVE mg/dL
Hgb urine dipstick: NEGATIVE
Ketones, ur: NEGATIVE mg/dL
Nitrite: NEGATIVE
Protein, ur: NEGATIVE mg/dL
Specific Gravity, Urine: 1.029 (ref 1.005–1.030)
pH: 6 (ref 5.0–8.0)

## 2020-02-22 LAB — POCT PREGNANCY, URINE: Preg Test, Ur: NEGATIVE

## 2020-02-22 MED ORDER — CYCLOBENZAPRINE HCL 10 MG PO TABS: 10.0000 mg | ORAL_TABLET | Freq: Three times a day (TID) | ORAL | 0 refills | Status: AC | PRN

## 2020-02-22 MED ORDER — IBUPROFEN 800 MG PO TABS: 800.0000 mg | ORAL_TABLET | Freq: Three times a day (TID) | ORAL | 0 refills | Status: DC | PRN

## 2020-02-22 MED ORDER — KETOROLAC TROMETHAMINE 60 MG/2ML IM SOLN
60.0000 mg | Freq: Once | INTRAMUSCULAR | Status: AC
  Administered 2020-02-22: 60 mg via INTRAMUSCULAR
  Filled 2020-02-22: qty 2

## 2020-02-22 MED ORDER — CYCLOBENZAPRINE HCL 10 MG PO TABS
10.0000 mg | ORAL_TABLET | Freq: Once | ORAL | Status: AC
  Administered 2020-02-22: 10 mg via ORAL
  Filled 2020-02-22: qty 1

## 2020-02-22 NOTE — ED Provider Notes (Signed)
[facility] Emergency Department Provider Note  ____________________________________________  Time seen: Approximately 4:05 AM  I have reviewed the triage vital signs and the nursing notes.   HISTORY  Chief Complaint Back Pain and Flank Pain   HPI Tracy Glover is a 29 y.o. female with a history of ovarian cyst who presents for evaluation of back pain.  Patient reports that her pain started 2 days ago.  Sharp constant, located in the middle of her lower back.  She woke up with the pain.  No trauma.  No IV drug use, no fever, no dysuria or hematuria, no abdominal pain, no nausea or vomiting, no saddle anesthesia, no lower extremity weakness or numbness, no urinary or bowel incontinence or retention.  The pain is worse with movement of her torso.   Past Medical History:  Diagnosis Date  . Cholecystitis   . Hx of gallstones 2016    Patient Active Problem List   Diagnosis Date Noted  . Postpartum care following cesarean delivery 03/24/2016  . Cardiac syncope 09/28/2015  . Acute cholecystitis 05/18/2015    Past Surgical History:  Procedure Laterality Date  . CESAREAN SECTION    . CESAREAN SECTION WITH BILATERAL TUBAL LIGATION N/A 05/07/2016   Procedure: CESAREAN SECTION WITH RIGHT TUBAL LIGATION;  Surgeon: Vena Austria, MD;  Location: [facility] ORS;  Service: Obstetrics;  Laterality: N/A;  . CHOLECYSTECTOMY N/A 05/19/2015   Procedure: LAPAROSCOPIC CHOLECYSTECTOMY;  Surgeon: Natale Lay, MD;  Location: [facility] ORS;  Service: General;  Laterality: N/A;    Prior to Admission medications   Medication Sig Start Date End Date Taking? Authorizing Provider  cyclobenzaprine (FLEXERIL) 10 MG tablet Take 1 tablet (10 mg total) by mouth 3 (three) times daily as needed for up to 7 days for muscle spasms. 02/22/20 02/29/20  Nita Sickle, MD  doxycycline (ADOXA) 100 MG tablet Take 1 tablet (100 mg total) by mouth 2 (two) times daily for 14 days. 02/15/20 02/29/20   Concha Se, MD  ibuprofen (ADVIL) 800 MG tablet Take 1 tablet (800 mg total) by mouth every 8 (eight) hours as needed. 02/22/20   Nita Sickle, MD  metroNIDAZOLE (FLAGYL) 500 MG tablet Take 1 tablet (500 mg total) by mouth 2 (two) times daily for 14 days. 02/15/20 02/29/20  Concha Se, MD    Allergies Patient has no known allergies.  Family History  Problem Relation Age of Onset  . Cancer Mother   . Kidney disease Father        Currently on Dialysis    Social History Social History   Tobacco Use  . Smoking status: Current Some Day Smoker    Types: Cigarettes  . Smokeless tobacco: Never Used  Substance Use Topics  . Alcohol use: Yes    Comment: occ.  . Drug use: Yes    Types: Marijuana    Comment: Former user: stop using after finding out she was pregnant per patient     Review of Systems  Constitutional: Negative for fever. Eyes: Negative for visual changes. ENT: Negative for sore throat. Neck: No neck pain  Cardiovascular: Negative for chest pain. Respiratory: Negative for shortness of breath. Gastrointestinal: Negative for abdominal pain, vomiting or diarrhea. Genitourinary: Negative for dysuria. Musculoskeletal: + low back pain. Skin: Negative for rash. Neurological: Negative for headaches, weakness or numbness. Psych: No SI or HI  ____________________________________________   PHYSICAL EXAM:  VITAL SIGNS: ED Triage Vitals  Enc Vitals Group     BP 02/21/20 2305 124/66  Pulse Rate 02/21/20 2302 73     Resp 02/21/20 2302 17     Temp 02/21/20 2302 98.7 F (37.1 C)     Temp Source 02/21/20 2302 Oral     SpO2 02/21/20 2302 98 %     Weight 02/21/20 2300 208 lb (94.3 kg)     Height 02/21/20 2300 5\' 6"  (1.676 m)     Head Circumference --      Peak Flow --      Pain Score 02/21/20 2300 8     Pain Loc --      Pain Edu? --      Excl. in [place]? --     Constitutional: Alert and oriented. Well appearing and in no apparent distress. HEENT:       Head: Normocephalic and atraumatic.         Eyes: Conjunctivae are normal. Sclera is non-icteric.       Mouth/Throat: Mucous membranes are moist.       Neck: Supple with no signs of meningismus. Cardiovascular: Regular rate and rhythm. No murmurs, gallops, or rubs.  Respiratory: Normal respiratory effort. Lungs are clear to auscultation bilaterally.  Gastrointestinal: Soft, non tender, and non distended with positive bowel sounds. No rebound or guarding. Musculoskeletal: Mild tenderness to palpation in the lower lumbar region midline and bilateral paraspinal regions with no deformity or bruising. Neurologic: Normal speech and language. Face is symmetric.  2+ DTRs, normal gait, intact strength and sensation x4 Skin: Skin is warm, dry and intact. No rash noted. Psychiatric: Mood and affect are normal. Speech and behavior are normal.  ____________________________________________   LABS (all labs ordered are listed, but only abnormal results are displayed)  Labs Reviewed  CBC WITH DIFFERENTIAL/PLATELET - Abnormal; Notable for the following components:      Result Value   Eosinophils Absolute 0.7 (*)    All other components within normal limits  URINALYSIS, COMPLETE (UACMP) WITH MICROSCOPIC - Abnormal; Notable for the following components:   Color, Urine YELLOW (*)    APPearance CLEAR (*)    Leukocytes,Ua TRACE (*)    All other components within normal limits  COMPREHENSIVE METABOLIC PANEL  POCT PREGNANCY, URINE   ____________________________________________  EKG  none  ____________________________________________  RADIOLOGY  I have personally reviewed the images performed during this visit and I agree with the Radiologist's read.   Interpretation by Radiologist:  DG Lumbar Spine Complete  Result Date: 02/22/2020 CLINICAL DATA:  Right-sided flank and bilateral lower back pain since Saturday. EXAM: LUMBAR SPINE - COMPLETE 4+ VIEW COMPARISON:  02/15/2020 CT FINDINGS: No evidence  of fracture, bone lesion, or endplate erosion. Borderline L5-S1 anterolisthesis which is likely accentuated by obliquity. L5-S1 mild disc space narrowing and bulging by recent CT. Transitional S1 vertebra IMPRESSION: No acute or focal finding. Electronically Signed   By: Monte Fantasia M.D.   On: 02/22/2020 04:33      ____________________________________________   PROCEDURES  Procedure(s) performed: None Procedures Critical Care performed:  None ____________________________________________   INITIAL IMPRESSION / ASSESSMENT AND PLAN / ED COURSE   29 y.o. female with a history of ovarian cyst who presents for evaluation of midline lower back pain x 2 days that started when she woke up and worse with movement of the torso.  She has mild tenderness to palpation on the lower lumbar region midline and bilateral paraspinal regions with no obvious deformities.  Completely neurologically intact.  No clinical signs of cauda equina.  No history of IVDU or injections on  her back therefore less likely abscess, disciitis, osteomyelitis. Most likely MSK. Will get XR to rule out compression fx. Will treat with IM toradol, flexeril.  Old medical records reviewed.  _________________________ 5:29 AM on 02/22/2020 -----------------------------------------  X-ray negative for any acute findings, confirmed by radiology.  Pain is markedly improved.  Will discharge home on ibuprofen, Flexeril, heat pads, and follow-up with PCP.  Discussed my standard return precautions.    _____________________________________________ Please note:  Patient was evaluated in Emergency Department today for the symptoms described in the history of present illness. Patient was evaluated in the context of the global COVID-19 pandemic, which necessitated consideration that the patient might be at risk for infection with the SARS-CoV-2 virus that causes COVID-19. Institutional protocols and algorithms that pertain to the evaluation of  patients at risk for COVID-19 are in a state of rapid change based on information released by regulatory bodies including the CDC and federal and state organizations. These policies and algorithms were followed during the patient's care in the ED.  Some ED evaluations and interventions may be delayed as a result of limited staffing during the pandemic.   [place] Controlled Substance Database was reviewed by me. ____________________________________________   FINAL CLINICAL IMPRESSION(S) / ED DIAGNOSES   Final diagnoses:  Lumbar sprain, initial encounter      NEW MEDICATIONS STARTED DURING THIS VISIT:  ED Discharge Orders         Ordered    ibuprofen (ADVIL) 800 MG tablet  Every 8 hours PRN     Discontinue  Reprint     02/22/20 0528    cyclobenzaprine (FLEXERIL) 10 MG tablet  3 times daily PRN     Discontinue  Reprint     02/22/20 0528           Note:  This document was prepared using Dragon voice recognition software and may include unintentional dictation errors.    Don Perking, [place], MD 02/22/20 [phone]

## 2020-02-22 NOTE — Discharge Instructions (Signed)
You have been seen in the Emergency Department (ED)  today for back pain.  Back pain has many possible causes some are related to muscles while others have more serious causes. Even though you were checked carefully today and your exam and evaluation were reassuring, problems may develop later or continue to unfold. Therefore it is imperative that you follow up with doctor closely for further evaluation.  Follow-up with your doctor in 1 day for further evaluation.  For pain control take: Ibuprofen 800 mg every 6-8 hours with a meal or snack, Flexeril 3 times a day.  Apply heat.  When should you call for help?  Call your doctor now or seek immediate medical care if:  You have new or worsening numbness in your legs.  You have new or worsening weakness in your legs. (This could make it hard to stand up.)  You lose control of your bladder or bowels or if you are unable to urinate. You have numbness of your groin or buttock region If you develop a fever  Watch closely for changes in your health, and be sure to contact your doctor if:  Your pain gets worse.  You are not getting better after 2 weeks.  How can you care for yourself at home?  Take pain medicines exactly as directed.  If the doctor gave you a prescription medicine for pain, take it as prescribed.  If you are not taking a prescription pain medicine, ask your doctor if you can take an over-the-counter medicine like tylenol or ibuprofen. Sit or lie in positions that are most comfortable and reduce your pain. Try one of these positions when you lie down:  Lie on your back with your knees bent and supported by large pillows.  Lie on the floor with your legs on the seat of a sofa or chair.  Lie on your side with your knees and hips bent and a pillow between your legs.  Lie on your stomach if it does not make pain worse. Do not sit up in bed, and avoid soft couches and twisted positions. Bed rest can help relieve pain at first, but it  delays healing. Avoid bed rest after the first day of back pain.  Change positions every 30 minutes. If you must sit for long periods of time, take breaks from sitting. Get up and walk around, or lie in a comfortable position.  Try using a heating pad on a low or medium setting for 15 to 20 minutes every 2 or 3 hours. Try a warm shower in place of one session with the heating pad.  You can also try an ice pack for 10 to 15 minutes every 2 to 3 hours. Put a thin cloth between the ice pack and your skin.  Take short walks several times a day. You can start with 5 to 10 minutes, 3 or 4 times a day, and work up to longer walks. Walk on level surfaces and avoid hills and stairs until your back is better.  Return to work and other activities as soon as you can. Continued rest without activity is usually not good for your back.  To prevent future back pain, do exercises to stretch and strengthen your back and stomach. Learn how to use good posture, safe lifting techniques, and proper body mechanics.

## 2020-02-22 NOTE — ED Notes (Signed)
Pt. POC resulted Neg. 

## 2020-02-24 LAB — CYTOLOGY - PAP: Diagnosis: NEGATIVE

## 2020-02-29 ENCOUNTER — Telehealth: Payer: Self-pay | Admitting: Obstetrics and Gynecology

## 2020-02-29 NOTE — Telephone Encounter (Signed)
Adv pt that she is scheduled for an [place] at [facility] [address], 8/17 @ 11:00 - arrive at 10:45 with full bladder

## 2020-02-29 NOTE — Telephone Encounter (Signed)
-----   Message from Natale Milch, MD sent at 02/21/2020  2:51 PM EDT ----- Needs [place] at hospital in 6-12 weeks.

## 2020-04-25 ENCOUNTER — Ambulatory Visit: Admission: RE | Admit: 2020-04-25 | Payer: Medicaid Other | Source: Ambulatory Visit

## 2020-07-12 ENCOUNTER — Ambulatory Visit: Payer: Self-pay

## 2020-09-19 ENCOUNTER — Ambulatory Visit (LOCAL_COMMUNITY_HEALTH_CENTER): Payer: Medicaid Other

## 2020-09-19 ENCOUNTER — Other Ambulatory Visit: Payer: Self-pay

## 2020-09-19 DIAGNOSIS — Z111 Encounter for screening for respiratory tuberculosis: Secondary | ICD-10-CM

## 2020-09-22 ENCOUNTER — Ambulatory Visit (LOCAL_COMMUNITY_HEALTH_CENTER): Payer: Medicaid Other

## 2020-09-22 ENCOUNTER — Other Ambulatory Visit: Payer: Self-pay

## 2020-09-22 DIAGNOSIS — Z111 Encounter for screening for respiratory tuberculosis: Secondary | ICD-10-CM

## 2020-09-22 LAB — TB SKIN TEST
Induration: 0 mm
TB Skin Test: NEGATIVE

## 2021-01-08 ENCOUNTER — Emergency Department: Payer: Medicaid Other

## 2021-01-08 ENCOUNTER — Emergency Department
Admission: EM | Admit: 2021-01-08 | Discharge: 2021-01-09 | Disposition: A | Discharge status: Home / Self Care | Payer: Medicaid Other | Attending: Emergency Medicine | Admitting: Emergency Medicine

## 2021-01-08 ENCOUNTER — Encounter: Payer: Self-pay | Admitting: *Deleted

## 2021-01-08 ENCOUNTER — Other Ambulatory Visit: Payer: Self-pay

## 2021-01-08 DIAGNOSIS — F17210 Nicotine dependence, cigarettes, uncomplicated: Secondary | ICD-10-CM | POA: Diagnosis not present

## 2021-01-08 DIAGNOSIS — Z28310 Unvaccinated for covid-19: Secondary | ICD-10-CM | POA: Diagnosis not present

## 2021-01-08 DIAGNOSIS — M25571 Pain in right ankle and joints of right foot: Secondary | ICD-10-CM | POA: Diagnosis not present

## 2021-01-08 DIAGNOSIS — M2550 Pain in unspecified joint: Secondary | ICD-10-CM

## 2021-01-08 DIAGNOSIS — M25521 Pain in right elbow: Secondary | ICD-10-CM | POA: Insufficient documentation

## 2021-01-08 DIAGNOSIS — M25572 Pain in left ankle and joints of left foot: Secondary | ICD-10-CM | POA: Diagnosis not present

## 2021-01-08 DIAGNOSIS — R42 Dizziness and giddiness: Secondary | ICD-10-CM | POA: Insufficient documentation

## 2021-01-08 DIAGNOSIS — R200 Anesthesia of skin: Secondary | ICD-10-CM | POA: Diagnosis not present

## 2021-01-08 DIAGNOSIS — M25522 Pain in left elbow: Secondary | ICD-10-CM | POA: Diagnosis not present

## 2021-01-08 DIAGNOSIS — R112 Nausea with vomiting, unspecified: Secondary | ICD-10-CM | POA: Insufficient documentation

## 2021-01-08 DIAGNOSIS — M25531 Pain in right wrist: Secondary | ICD-10-CM | POA: Diagnosis not present

## 2021-01-08 DIAGNOSIS — M25532 Pain in left wrist: Secondary | ICD-10-CM | POA: Insufficient documentation

## 2021-01-08 LAB — BASIC METABOLIC PANEL
Anion gap: 8 (ref 5–15)
BUN: 13 mg/dL (ref 6–20)
CO2: 24 mmol/L (ref 22–32)
Calcium: 8.7 mg/dL — ABNORMAL LOW (ref 8.9–10.3)
Chloride: 106 mmol/L (ref 98–111)
Creatinine, Ser: 0.99 mg/dL (ref 0.44–1.00)
GFR, Estimated: >60 mL/min (ref >60)
Glucose, Bld: 97 mg/dL (ref 70–99)
Potassium: 4.2 mmol/L (ref 3.5–5.1)
Sodium: 138 mmol/L (ref 135–145)

## 2021-01-08 LAB — CBC
HCT: 37.8 % (ref 36.0–46.0)
Hemoglobin: 12.3 g/dL (ref 12.0–15.0)
MCH: 29.1 pg (ref 26.0–34.0)
MCHC: 32.5 g/dL (ref 30.0–36.0)
MCV: 89.4 fL (ref 80.0–100.0)
Platelets: 363 10*3/uL (ref 150–400)
RBC: 4.23 MIL/uL (ref 3.87–5.11)
RDW: 12.9 % (ref 11.5–15.5)
WBC: 10.2 10*3/uL (ref 4.0–10.5)
nRBC: 0 % (ref 0.0–0.2)

## 2021-01-08 LAB — POC URINE PREG, ED: Preg Test, Ur: NEGATIVE

## 2021-01-08 LAB — TROPONIN I (HIGH SENSITIVITY): Troponin I (High Sensitivity): 3 ng/L (ref <18)

## 2021-01-08 MED ORDER — SODIUM CHLORIDE 0.9 % IV BOLUS (SEPSIS)
1000.0000 mL | Freq: Once | INTRAVENOUS | Status: AC
  Administered 2021-01-09: 1000 mL via INTRAVENOUS

## 2021-01-08 MED ORDER — KETOROLAC TROMETHAMINE 30 MG/ML IJ SOLN
30.0000 mg | Freq: Once | INTRAMUSCULAR | Status: AC
  Administered 2021-01-09: 30 mg via INTRAVENOUS
  Filled 2021-01-08: qty 1

## 2021-01-08 NOTE — ED Provider Notes (Signed)
[facility] Emergency Department Provider Note  ____________________________________________   Event Date/Time   First MD Initiated Contact with Patient 01/08/21 2338     (approximate)  I have reviewed the triage vital signs and the nursing notes.   HISTORY  Chief Complaint Numbness    HPI Tracy Glover is a 30 y.o. female with no significant past medical history who presents to the emergency department with complaints of feeling pain in her elbows, wrist, hands, ankles and feet for the past 1 to 2 days.  States that she is feeling achy.  She is also felt lightheaded with standing.  She denies any fever but has had dry cough, nasal congestion.  Children with similar symptoms and tested negative for COVID-19.  She is not vaccinated against COVID-19 or influenza.  No recent tick bites.  No recent travel.    She reports episodes of vomiting every day after eating for the past several months.  She has not seen her PCP for this.  She states she thinks is related to heartburn and that her father recently gave her medication for heartburn.  She denies any diarrhea.    No chest pain or shortness of breath.  She denies numbness, tingling, focal weakness.  No neck or back pain.        Past Medical History:  Diagnosis Date  . Cholecystitis   . Hx of gallstones 2016    Patient Active Problem List   Diagnosis Date Noted  . Postpartum care following cesarean delivery 03/24/2016  . Cardiac syncope 09/28/2015  . Acute cholecystitis 05/18/2015    Past Surgical History:  Procedure Laterality Date  . CESAREAN SECTION    . CESAREAN SECTION WITH BILATERAL TUBAL LIGATION N/A 05/07/2016   Procedure: CESAREAN SECTION WITH RIGHT TUBAL LIGATION;  Surgeon: Vena Austria, MD;  Location: [facility] ORS;  Service: Obstetrics;  Laterality: N/A;  . CHOLECYSTECTOMY N/A 05/19/2015   Procedure: LAPAROSCOPIC CHOLECYSTECTOMY;  Surgeon: Natale Lay, MD;  Location: [facility] ORS;  Service:  General;  Laterality: N/A;    Prior to Admission medications   Medication Sig Start Date End Date Taking? Authorizing Provider  ondansetron (ZOFRAN ODT) 4 MG disintegrating tablet Take 1 tablet (4 mg total) by mouth every 6 (six) hours as needed for nausea or vomiting. 01/09/21  Yes Yeshaya Vath N, DO  pantoprazole (PROTONIX) 40 MG tablet Take 1 tablet (40 mg total) by mouth daily. 01/09/21 01/09/22 Yes Tarig Zimmers N, DO  ibuprofen (ADVIL) 800 MG tablet Take 1 tablet (800 mg total) by mouth every 8 (eight) hours as needed. 02/22/20   Nita Sickle, MD    Allergies Patient has no known allergies.  Family History  Problem Relation Age of Onset  . Cancer Mother   . Kidney disease Father        Currently on Dialysis    Social History Social History   Tobacco Use  . Smoking status: Current Some Day Smoker    Types: Cigarettes  . Smokeless tobacco: Never Used  Substance Use Topics  . Alcohol use: Yes    Comment: occ.  . Drug use: Yes    Types: Marijuana    Comment: Former user: stop using after finding out she was pregnant per patient     Review of Systems Constitutional: No fever. Eyes: No visual changes. ENT: No sore throat. Cardiovascular: Denies chest pain. Respiratory: Denies shortness of breath. Gastrointestinal: + nausea and vomiting Genitourinary: Negative for dysuria. Musculoskeletal: Negative for back pain. Skin: Negative  for rash. Neurological: Negative for focal weakness or numbness.  ____________________________________________   PHYSICAL EXAM:  VITAL SIGNS: ED Triage Vitals  Enc Vitals Group     BP 01/08/21 2220 125/72     Pulse Rate 01/08/21 2220 79     Resp 01/08/21 2220 18     Temp 01/08/21 2220 98.1 F (36.7 C)     Temp Source 01/08/21 2220 Oral     SpO2 01/08/21 2220 96 %     Weight 01/08/21 2221 200 lb (90.7 kg)     Height 01/08/21 2221 5\' 6"  (1.676 m)     Head Circumference --      Peak Flow --      Pain Score 01/08/21 2220 8     Pain  Loc --      Pain Edu? --      Excl. in GC? --    CONSTITUTIONAL: Alert and oriented and responds appropriately to questions. Well-appearing; well-nourished HEAD: Normocephalic EYES: Conjunctivae clear, pupils appear equal, EOM appear intact ENT: normal nose; moist mucous membranes NECK: Supple, normal ROM no midline spinal tenderness or step-off or deformity CARD: RRR; S1 and S2 appreciated; no murmurs, no clicks, no rubs, no gallops RESP: Normal chest excursion without splinting or tachypnea; breath sounds clear and equal bilaterally; no wheezes, no rhonchi, no rales, no hypoxia or respiratory distress, speaking full sentences ABD/GI: Normal bowel sounds; non-distended; soft, non-tender, no rebound, no guarding, no peritoneal signs, no hepatosplenomegaly BACK: The back appears normal no midline spinal tenderness or step-off or deformity EXT: Normal ROM in all joints; no deformity noted, no edema; no cyanosis, no calf tenderness or calf swelling, normal capillary refill, 2+ radial and DP pulses bilaterally, no joint effusions, compartments soft SKIN: Normal color for age and race; warm; no rash on exposed skin NEURO: Moves all extremities equally, normal sensation diffusely, cranial nerves II through XII intact, normal speech, normal gait, normal deep tendon reflexes diffusely PSYCH: The patient's mood and manner are appropriate.  ____________________________________________   LABS (all labs ordered are listed, but only abnormal results are displayed)  Labs Reviewed  BASIC METABOLIC PANEL - Abnormal; Notable for the following components:      Result Value   Calcium 8.7 (*)    All other components within normal limits  CBC  URINALYSIS, ROUTINE W REFLEX MICROSCOPIC  HEPATIC FUNCTION PANEL  LIPASE, BLOOD  POC URINE PREG, ED  TROPONIN I (HIGH SENSITIVITY)  TROPONIN I (HIGH SENSITIVITY)   ____________________________________________  EKG   EKG Interpretation  Date/Time:  Monday  Jan 08 2021 22:26:56 EDT Ventricular Rate:  72 PR Interval:  148 QRS Duration: 74 QT Interval:  406 QTC Calculation: 444 R Axis:   8 Text Interpretation: Normal sinus rhythm Cannot rule out Anterior infarct , age undetermined Abnormal ECG Confirmed by 07-11-1997 [phone]) on 01/08/2021 11:41:33 PM       ____________________________________________  RADIOLOGY 03/10/2021 Deanda Ruddell, personally viewed and evaluated these images (plain radiographs) as part of my medical decision making, as well as reviewing the written report by the radiologist.  ED MD interpretation: Chest x-ray clear.  Official radiology report(s): DG Chest 2 View  Result Date: 01/08/2021 CLINICAL DATA:  Lightheadedness, upper extremity numbness for 2 days EXAM: CHEST - 2 VIEW COMPARISON:  12/17/2017 FINDINGS: The heart size and mediastinal contours are within normal limits. Both lungs are clear. The visualized skeletal structures are unremarkable. IMPRESSION: No active cardiopulmonary disease. Electronically Signed   By: 02/16/2018.D.  On: 01/08/2021 22:50    ____________________________________________   PROCEDURES  Procedure(s) performed (including Critical Care):  Procedures   ____________________________________________   INITIAL IMPRESSION / ASSESSMENT AND PLAN / ED COURSE  As part of my medical decision making, I reviewed the following data within the electronic MEDICAL RECORD NUMBER Nursing notes reviewed and incorporated, Labs reviewed , EKG interpreted , Old EKG reviewed, Old chart reviewed, Radiograph reviewed  and Notes from prior ED visits         Patient here with complaints of arthralgias for a couple of days.  Also has had nasal congestion and cough.  Reports children with similar URI symptoms last week.  She is not vaccinated against COVID-19 or influenza.  Have offered testing today which she declines.  Discussed arthralgias may be secondary to viral illness.  She has no sign of gout, septic  arthritis, cellulitis, compartment syndrome on exam.  She is neurovascularly intact distally.  Initially she told triage nurse that she was feeling numb in her hands and feet.  After this was explained to her what numbness meant, patient states that she has no numbness or tingling.  Her neurologic exam today is normal.  Doubt CVA, Guillain-Barr, meningitis, radiculopathy, spinal stenosis, epidural abscess or hematoma, discitis or osteomyelitis.  She states that she is actually having more pain in her joints than anything else.  Initial labs obtained in triage are reassuring.  Normal troponin, white count, hemoglobin, electrolytes.  Pregnancy test negative.  Chest x-ray clear.  EKG nonischemic.  Given these episodes of vomiting every day for several weeks, we will add on LFTs, lipase and a urinalysis.  Will give Toradol, IV fluid for symptomatic relief.  I suspect that her joint aches, nasal congestion, cough may be from a viral illness.  I suspect that her episodes of vomiting for the past several weeks are related to acid reflux.  Anticipate discharge home with GI follow-up, prescription for Protonix, Zofran.  ED PROGRESS  Patient reports feeling much better after Toradol and IV fluids.  I feel she is safe to be discharged home.  Additional labs, urine unremarkable.  Pregnancy test negative.  Recommended close follow-up with her PCP if symptoms of joint pain continue.  Recommend follow-up with GI for her symptoms of nausea, vomiting after eating.  Will discharge with Zofran, Protonix.  Recommended diet changes.   At this time, I do not feel there is any life-threatening condition present. I have reviewed, interpreted and discussed all results (EKG, imaging, lab, urine as appropriate) and exam findings with patient/family. I have reviewed nursing notes and appropriate previous records.  I feel the patient is safe to be discharged home without further emergent workup and can continue workup as an outpatient  as needed. Discussed usual and customary return precautions. Patient/family verbalize understanding and are comfortable with this plan.  Outpatient follow-up has been provided as needed. All questions have been answered.  ____________________________________________   FINAL CLINICAL IMPRESSION(S) / ED DIAGNOSES  Final diagnoses:  Arthralgia, unspecified joint  Nausea and vomiting in adult     ED Discharge Orders         Ordered    ondansetron (ZOFRAN ODT) 4 MG disintegrating tablet  Every 6 hours PRN        01/09/21 0001    pantoprazole (PROTONIX) 40 MG tablet  Daily        01/09/21 0001          *Please note:  Sharen HintBrittany N Ipock was evaluated in Emergency Department  on 01/09/2021 for the symptoms described in the history of present illness. She was evaluated in the context of the global COVID-19 pandemic, which necessitated consideration that the patient might be at risk for infection with the SARS-CoV-2 virus that causes COVID-19. Institutional protocols and algorithms that pertain to the evaluation of patients at risk for COVID-19 are in a state of rapid change based on information released by regulatory bodies including the CDC and federal and state organizations. These policies and algorithms were followed during the patient's care in the ED.  Some ED evaluations and interventions may be delayed as a result of limited staffing during and the pandemic.*   Note:  This document was prepared using Dragon voice recognition software and may include unintentional dictation errors.   Demorio Seeley, Layla Maw, DO 01/09/21 [phone]

## 2021-01-08 NOTE — ED Triage Notes (Signed)
Pt report feeling light headed and numbness in arms   Sx began 2 days ago.  No chest pain  Or sob.  No headache.  Pt alert  Speech clear.

## 2021-01-09 LAB — URINALYSIS, ROUTINE W REFLEX MICROSCOPIC
Bilirubin Urine: NEGATIVE
Glucose, UA: NEGATIVE mg/dL
Ketones, ur: NEGATIVE mg/dL
Leukocytes,Ua: NEGATIVE
Nitrite: NEGATIVE
Protein, ur: NEGATIVE mg/dL
Specific Gravity, Urine: 1.03 (ref 1.005–1.030)
pH: 5 (ref 5.0–8.0)

## 2021-01-09 LAB — HEPATIC FUNCTION PANEL
ALT: 16 U/L (ref 0–44)
AST: 32 U/L (ref 15–41)
Albumin: 3.5 g/dL (ref 3.5–5.0)
Alkaline Phosphatase: 74 U/L (ref 38–126)
Bilirubin, Direct: <0.1 mg/dL (ref 0.0–0.2)
Total Bilirubin: 0.5 mg/dL (ref 0.3–1.2)
Total Protein: 7.1 g/dL (ref 6.5–8.1)

## 2021-01-09 LAB — LIPASE, BLOOD: Lipase: 35 U/L (ref 11–51)

## 2021-01-09 MED ORDER — PANTOPRAZOLE SODIUM 40 MG IV SOLR
40.0000 mg | Freq: Once | INTRAVENOUS | Status: AC
  Administered 2021-01-09: 40 mg via INTRAVENOUS
  Filled 2021-01-09: qty 40

## 2021-01-09 MED ORDER — PANTOPRAZOLE SODIUM 40 MG PO TBEC: 40.0000 mg | DELAYED_RELEASE_TABLET | Freq: Every day | ORAL | 1 refills | Status: DC

## 2021-01-09 MED ORDER — ONDANSETRON 4 MG PO TBDP: 4.0000 mg | ORAL_TABLET | Freq: Four times a day (QID) | ORAL | 0 refills | Status: DC | PRN

## 2021-01-09 NOTE — Discharge Instructions (Addendum)
You may alternate Tylenol 1000 mg every 6 hours as needed for pain, fever and Ibuprofen 800 mg every 8 hours as needed for pain, fever.  Please take Ibuprofen with food.  Do not take more than 4000 mg of Tylenol (acetaminophen) in a 24 hour period.  

## 2021-10-28 ENCOUNTER — Encounter: Payer: Self-pay | Admitting: Emergency Medicine

## 2021-10-28 ENCOUNTER — Other Ambulatory Visit: Payer: Self-pay

## 2021-10-28 ENCOUNTER — Emergency Department: Payer: Medicaid Other

## 2021-10-28 ENCOUNTER — Inpatient Hospital Stay
Admission: EM | Admit: 2021-10-28 | Discharge: 2021-10-31 | DRG: 760 | Disposition: A | Discharge status: Home / Self Care | Payer: Medicaid Other | Attending: Obstetrics and Gynecology | Admitting: Obstetrics and Gynecology

## 2021-10-28 DIAGNOSIS — N80121 Deep endometriosis of right ovary: Principal | ICD-10-CM | POA: Diagnosis present

## 2021-10-28 DIAGNOSIS — F17210 Nicotine dependence, cigarettes, uncomplicated: Secondary | ICD-10-CM | POA: Diagnosis present

## 2021-10-28 DIAGNOSIS — N7093 Salpingitis and oophoritis, unspecified: Secondary | ICD-10-CM | POA: Diagnosis not present

## 2021-10-28 DIAGNOSIS — Z20822 Contact with and (suspected) exposure to covid-19: Secondary | ICD-10-CM | POA: Diagnosis present

## 2021-10-28 DIAGNOSIS — R102 Pelvic and perineal pain: Secondary | ICD-10-CM

## 2021-10-28 DIAGNOSIS — A549 Gonococcal infection, unspecified: Secondary | ICD-10-CM | POA: Diagnosis present

## 2021-10-28 HISTORY — DX: Other psychoactive substance abuse, uncomplicated: F19.10

## 2021-10-28 LAB — URINALYSIS, COMPLETE (UACMP) WITH MICROSCOPIC
Bacteria, UA: NONE SEEN
Bilirubin Urine: NEGATIVE
Glucose, UA: NEGATIVE mg/dL
Ketones, ur: 5 mg/dL — AB
Nitrite: NEGATIVE
Protein, ur: 30 mg/dL — AB
Specific Gravity, Urine: 1.033 — ABNORMAL HIGH (ref 1.005–1.030)
WBC, UA: >50 WBC/hpf — ABNORMAL HIGH (ref 0–5)
pH: 5 (ref 5.0–8.0)

## 2021-10-28 LAB — TROPONIN I (HIGH SENSITIVITY): Troponin I (High Sensitivity): <2 ng/L (ref <18)

## 2021-10-28 LAB — LIPASE, BLOOD: Lipase: 33 U/L (ref 11–51)

## 2021-10-28 LAB — WET PREP, GENITAL
Sperm: NONE SEEN
Trich, Wet Prep: NONE SEEN
WBC, Wet Prep HPF POC: >=10 — AB (ref <10)
Yeast Wet Prep HPF POC: NONE SEEN

## 2021-10-28 LAB — CBC
HCT: 37 % (ref 36.0–46.0)
Hemoglobin: 11.7 g/dL — ABNORMAL LOW (ref 12.0–15.0)
MCH: 28.2 pg (ref 26.0–34.0)
MCHC: 31.6 g/dL (ref 30.0–36.0)
MCV: 89.2 fL (ref 80.0–100.0)
Platelets: 381 10*3/uL (ref 150–400)
RBC: 4.15 MIL/uL (ref 3.87–5.11)
RDW: 14 % (ref 11.5–15.5)
WBC: 7.7 10*3/uL (ref 4.0–10.5)
nRBC: 0 % (ref 0.0–0.2)

## 2021-10-28 LAB — COMPREHENSIVE METABOLIC PANEL
ALT: 17 U/L (ref 0–44)
AST: 18 U/L (ref 15–41)
Albumin: 3.9 g/dL (ref 3.5–5.0)
Alkaline Phosphatase: 63 U/L (ref 38–126)
Anion gap: 10 (ref 5–15)
BUN: 10 mg/dL (ref 6–20)
CO2: 24 mmol/L (ref 22–32)
Calcium: 9.3 mg/dL (ref 8.9–10.3)
Chloride: 100 mmol/L (ref 98–111)
Creatinine, Ser: 0.9 mg/dL (ref 0.44–1.00)
GFR, Estimated: >60 mL/min (ref >60)
Glucose, Bld: 115 mg/dL — ABNORMAL HIGH (ref 70–99)
Potassium: 3.5 mmol/L (ref 3.5–5.1)
Sodium: 134 mmol/L — ABNORMAL LOW (ref 135–145)
Total Bilirubin: 0.6 mg/dL (ref 0.3–1.2)
Total Protein: 8 g/dL (ref 6.5–8.1)

## 2021-10-28 LAB — CHLAMYDIA/NGC RT PCR (ARMC ONLY)
Chlamydia Tr: NOT DETECTED
Chlamydia Tr: NOT DETECTED
N gonorrhoeae: DETECTED — AB
N gonorrhoeae: DETECTED — AB

## 2021-10-28 LAB — RESP PANEL BY RT-PCR (FLU A&B, COVID) ARPGX2
Influenza A by PCR: NEGATIVE
Influenza B by PCR: NEGATIVE
SARS Coronavirus 2 by RT PCR: NEGATIVE

## 2021-10-28 LAB — URINE DRUG SCREEN, QUALITATIVE (ARMC ONLY)
Amphetamines, Ur Screen: NOT DETECTED
Barbiturates, Ur Screen: NOT DETECTED
Benzodiazepine, Ur Scrn: NOT DETECTED
Cannabinoid 50 Ng, Ur ~~LOC~~: POSITIVE — AB
Cocaine Metabolite,Ur ~~LOC~~: POSITIVE — AB
MDMA (Ecstasy)Ur Screen: NOT DETECTED
Methadone Scn, Ur: NOT DETECTED
Opiate, Ur Screen: POSITIVE — AB
Phencyclidine (PCP) Ur S: NOT DETECTED
Tricyclic, Ur Screen: NOT DETECTED

## 2021-10-28 LAB — HCG, QUANTITATIVE, PREGNANCY: hCG, Beta Chain, Quant, S: <1 m[IU]/mL (ref <5)

## 2021-10-28 LAB — POC URINE PREG, ED: Preg Test, Ur: NEGATIVE

## 2021-10-28 MED ORDER — OXYCODONE-ACETAMINOPHEN 5-325 MG PO TABS
1.0000 | ORAL_TABLET | Freq: Once | ORAL | Status: AC
  Administered 2021-10-28: 1 via ORAL
  Filled 2021-10-28: qty 1

## 2021-10-28 MED ORDER — SODIUM CHLORIDE 0.9 % IV SOLN
100.0000 mg | Freq: Two times a day (BID) | INTRAVENOUS | Status: DC
  Administered 2021-10-28 – 2021-10-30 (×5): 100 mg via INTRAVENOUS
  Filled 2021-10-28 (×7): qty 100

## 2021-10-28 MED ORDER — SODIUM CHLORIDE 0.9 % IV SOLN
1.0000 g | Freq: Once | INTRAVENOUS | Status: AC
  Administered 2021-10-28: 1 g via INTRAVENOUS
  Filled 2021-10-28: qty 10

## 2021-10-28 MED ORDER — DEXTROSE IN LACTATED RINGERS 5 % IV SOLN: INTRAVENOUS | Status: DC

## 2021-10-28 MED ORDER — ACETAMINOPHEN 325 MG PO TABS
650.0000 mg | ORAL_TABLET | ORAL | Status: DC | PRN
  Administered 2021-10-31: 650 mg via ORAL
  Filled 2021-10-28: qty 2

## 2021-10-28 MED ORDER — ONDANSETRON 4 MG PO TBDP
4.0000 mg | ORAL_TABLET | Freq: Once | ORAL | Status: AC
  Administered 2021-10-28: 4 mg via ORAL
  Filled 2021-10-28: qty 1

## 2021-10-28 MED ORDER — SODIUM CHLORIDE 0.9 % IV SOLN
1.0000 g | INTRAVENOUS | Status: DC
  Administered 2021-10-29 – 2021-10-30 (×2): 1 g via INTRAVENOUS
  Filled 2021-10-28: qty 1
  Filled 2021-10-28 (×2): qty 10

## 2021-10-28 MED ORDER — SODIUM CHLORIDE 0.9 % IV SOLN: INTRAVENOUS | Status: DC | PRN

## 2021-10-28 MED ORDER — MORPHINE SULFATE (PF) 4 MG/ML IV SOLN
4.0000 mg | Freq: Once | INTRAVENOUS | Status: AC
  Administered 2021-10-28: 4 mg via INTRAVENOUS
  Filled 2021-10-28: qty 1

## 2021-10-28 MED ORDER — HYDROCODONE-ACETAMINOPHEN 5-325 MG PO TABS
1.0000 | ORAL_TABLET | ORAL | Status: DC | PRN
  Administered 2021-10-28 – 2021-10-29 (×3): 1 via ORAL
  Administered 2021-10-29: 2 via ORAL
  Administered 2021-10-29 (×2): 1 via ORAL
  Administered 2021-10-30 – 2021-10-31 (×4): 2 via ORAL
  Filled 2021-10-28: qty 2
  Filled 2021-10-28 (×2): qty 1
  Filled 2021-10-28: qty 2
  Filled 2021-10-28: qty 1
  Filled 2021-10-28 (×4): qty 2
  Filled 2021-10-28: qty 1

## 2021-10-28 MED ORDER — METRONIDAZOLE 500 MG/100ML IV SOLN
500.0000 mg | Freq: Two times a day (BID) | INTRAVENOUS | Status: DC
  Administered 2021-10-29 – 2021-10-30 (×5): 500 mg via INTRAVENOUS
  Filled 2021-10-28 (×7): qty 100

## 2021-10-28 MED ORDER — ONDANSETRON HCL 4 MG/2ML IJ SOLN
4.0000 mg | Freq: Once | INTRAMUSCULAR | Status: AC
  Administered 2021-10-28: 4 mg via INTRAVENOUS
  Filled 2021-10-28: qty 2

## 2021-10-28 NOTE — ED Notes (Addendum)
Notified lab to run urine drug screen off of urine in lab

## 2021-10-28 NOTE — H&P (Incomplete Revision)
ADMIT NOTE  HPI:      Tracy Glover is a 31 y.o. [phone] who LMP was Patient's last menstrual period was 10/22/2021 (approximate). Presented to the ED today with right-sided abd pain for 2 days.  Reports occ nausea during this time.  Denies fever, urinary issues and vomiting.  H/O PID, ovarian cyst and tubal ligation.           HISTORY No Known Allergies  OB History  OB History  Gravida Para Term Preterm AB Living  4 3 1 2 1 3   SAB IAB Ectopic Multiple Live Births    1   0 3    # Outcome Date GA Lbr Len/2nd Weight Sex Delivery Anes PTL Lv  4 Term 05/07/16 [redacted]w[redacted]d  3020 g M CS-LTranv Spinal  LIV  3 Preterm 03/01/12     CS-LTranv   LIV  2 Preterm 08/09/08     CS-LTranv   LIV  1 IAB             Past Medical History  Past Medical History:  Diagnosis Date   Cholecystitis    Hx of gallstones 2016    Past Surgical History  Past Surgical History:  Procedure Laterality Date   CESAREAN SECTION     CESAREAN SECTION WITH BILATERAL TUBAL LIGATION N/A 05/07/2016   Procedure: CESAREAN SECTION WITH RIGHT TUBAL LIGATION;  Surgeon: Malachy Mood, MD;  Location: [facility] ORS;  Service: Obstetrics;  Laterality: N/A;   CHOLECYSTECTOMY N/A 05/19/2015   Procedure: LAPAROSCOPIC CHOLECYSTECTOMY;  Surgeon: Sherri Rad, MD;  Location: [facility] ORS;  Service: General;  Laterality: N/A;      Past Social History:  Social History   Socioeconomic History   Marital status: Single    Spouse name: Not on file   Number of children: Not on file   Years of education: Not on file   Highest education level: Not on file  Occupational History   Not on file  Tobacco Use   Smoking status: Some Days    Types: Cigarettes   Smokeless tobacco: Never  Substance and Sexual Activity   Alcohol use: Yes    Comment: occ.   Drug use: Yes    Types: Marijuana    Comment: Former user: stop using after finding out she was pregnant per patient    Sexual activity: Yes    Birth control/protection:  Surgical  Other Topics Concern   Not on file  Social History Narrative   Not on file   Social Determinants of Health   Financial Resource Strain: Not on file  Food Insecurity: Not on file  Transportation Needs: Not on file  Physical Activity: Not on file  Stress: Not on file  Social Connections: Not on file    Family History  Family History  Problem Relation Age of Onset   Cancer Mother    Kidney disease Father        Currently on Dialysis     ROS: Constitutional: Denied constitutional symptoms, night sweats, recent illness, fatigue, fever, insomnia and weight loss.  Eyes: Denied eye symptoms, eye pain, photophobia, vision change and visual disturbance.  Ears/Nose/Throat/Neck: Denied ear, nose, throat or neck symptoms, hearing loss, nasal discharge, sinus congestion and sore throat.  Cardiovascular: Denied cardiovascular symptoms, arrhythmia, chest pain/pressure, edema, exercise intolerance, orthopnea and palpitations.  Respiratory: Denied pulmonary symptoms, asthma, pleuritic pain, productive sputum, cough, dyspnea and wheezing.  Gastrointestinal: Denied, gastro-esophageal reflux, melena, nausea and vomiting.  Genitourinary:  Denied genitourinary symptoms including symptomatic vaginal discharge, pelvic relaxation issues, and urinary complaints.  Musculoskeletal: Denied musculoskeletal symptoms, stiffness, swelling, muscle weakness and myalgia.  Dermatologic: Denied dermatology symptoms, rash and scar.  Neurologic: Denied neurology symptoms, dizziness, headache, neck pain and syncope.  Psychiatric: Denied psychiatric symptoms, anxiety and depression.  Endocrine: Denied endocrine symptoms including hot flashes and night sweats.   Medications    Patient's Medications  New Prescriptions   No medications on file  Previous Medications   IBUPROFEN (ADVIL) 800 MG TABLET    Take 1 tablet (800 mg total) by mouth every 8 (eight) hours as needed.   ONDANSETRON (ZOFRAN ODT) 4 MG  DISINTEGRATING TABLET    Take 1 tablet (4 mg total) by mouth every 6 (six) hours as needed for nausea or vomiting.   PANTOPRAZOLE (PROTONIX) 40 MG TABLET    Take 1 tablet (40 mg total) by mouth daily.  Modified Medications   No medications on file  Discontinued Medications   No medications on file   @ENCMED @   Objective: Vitals:   10/28/21 1717 10/28/21 2017  BP: 122/74 119/70  Pulse: 78 81  Resp: 20 20  Temp:    SpO2: 98% 100%      HEENT: Grossly within normal limits.  Normo-cephalic.  Neck Supple.  Pupils reactive.  Thyroid Smooth without nodularity or enlargement.  Skin No rashes, lesions or ulceration  Breasts: No masses or discharge.  Symmetric.  No axillary adenopathy.  Lungs: Clear to auscultation.  No rales or wheezes.  Heart: NSR.  No murmurs or rubs appreciated.  Abdomen: Soft.  Non-tender.  No masses.  No HSM. No rebound no guarding  Extremities: Moves all appropriately.  Normal ROM for age.  Neuro: Oriented to PPT.  Normal mood.    Pelvic: Deferred   [place]:          Right ovary   A 6.9 x 6.1 x 5.6 cm soft tissue structure in the RIGHT adnexal region is noted with more central area demonstrating internal vascular flow with arterial and venous waveforms and likely represents the RIGHT ovary. More peripheral aspect of this soft tissue structure may represent a small amount of adjacent complex fluid or blood but abnormal RIGHT ovary or mass is difficult to entirely exclude.   Left ovary   Not visualized   Pulsed Doppler evaluation of the RIGHT ovary demonstrates normal low-resistance arterial and venous waveforms within the central portion.  ASSESSMENT:  1.  Right-sided pelvic pain    2.  POSITIVE GC   PID? 3.  TOA on right?   Doubt torsion  PLAN: 1.  Admit to observation 2.  IV abx 3.  Pain relief  I spent 45 minutes involved in the care of this patient preparing to see the patient by obtaining and reviewing her medical history (including labs,  imaging tests and prior procedures), documenting clinical information in the electronic health record (EHR), counseling and coordinating care plans, writing and sending prescriptions, ordering tests or procedures and in direct communicating with the patient and medical staff discussing pertinent items from her history and physical exam.  Jeannie Fend ,MD 10/28/2021,9:08 PM

## 2021-10-28 NOTE — ED Notes (Signed)
Pt transported to US

## 2021-10-28 NOTE — ED Notes (Signed)
Patient requesting additional pain medication. Will speak to provider. She did receiving Percocet 3 hrs ago but I told her that I would double check with the provider.

## 2021-10-28 NOTE — H&P (Addendum)
ADMIT NOTE  HPI:      [name] is a 31 y.o. [phone] who LMP was Patient's last menstrual period was 10/22/2021 (approximate). Presented to the ED today with right-sided abd pain for 2 days.  Reports occ nausea during this time.  Denies fever, urinary issues and vomiting.  H/O PID, ovarian cyst and tubal ligation.           HISTORY No Known Allergies  OB History  OB History  Gravida Para Term Preterm AB Living  4 3 1 2 1 3   SAB IAB Ectopic Multiple Live Births    1   0 3    # Outcome Date GA Lbr Len/2nd Weight Sex Delivery Anes PTL Lv  4 Term 05/07/16 [redacted]w[redacted]d  3020 g M CS-LTranv Spinal  LIV  3 Preterm 03/01/12     CS-LTranv   LIV  2 Preterm 08/09/08     CS-LTranv   LIV  1 IAB             Past Medical History  Past Medical History:  Diagnosis Date   Cholecystitis    Hx of gallstones 2016    Past Surgical History  Past Surgical History:  Procedure Laterality Date   CESAREAN SECTION     CESAREAN SECTION WITH BILATERAL TUBAL LIGATION N/A 05/07/2016   Procedure: CESAREAN SECTION WITH RIGHT TUBAL LIGATION;  Surgeon: Malachy Mood, MD;  Location: [facility] ORS;  Service: Obstetrics;  Laterality: N/A;   CHOLECYSTECTOMY N/A 05/19/2015   Procedure: LAPAROSCOPIC CHOLECYSTECTOMY;  Surgeon: Sherri Rad, MD;  Location: [facility] ORS;  Service: General;  Laterality: N/A;      Past Social History:  Social History   Socioeconomic History   Marital status: Single    Spouse name: Not on file   Number of children: Not on file   Years of education: Not on file   Highest education level: Not on file  Occupational History   Not on file  Tobacco Use   Smoking status: Some Days    Types: Cigarettes   Smokeless tobacco: Never  Substance and Sexual Activity   Alcohol use: Yes    Comment: occ.   Drug use: Yes    Types: Marijuana    Comment: Former user: stop using after finding out she was pregnant per patient    Sexual activity: Yes    Birth control/protection:  Surgical  Other Topics Concern   Not on file  Social History Narrative   Not on file   Social Determinants of Health   Financial Resource Strain: Not on file  Food Insecurity: Not on file  Transportation Needs: Not on file  Physical Activity: Not on file  Stress: Not on file  Social Connections: Not on file    Family History  Family History  Problem Relation Age of Onset   Cancer Mother    Kidney disease Father        Currently on Dialysis     ROS: Constitutional: Denied constitutional symptoms, night sweats, recent illness, fatigue, fever, insomnia and weight loss.  Eyes: Denied eye symptoms, eye pain, photophobia, vision change and visual disturbance.  Ears/Nose/Throat/Neck: Denied ear, nose, throat or neck symptoms, hearing loss, nasal discharge, sinus congestion and sore throat.  Cardiovascular: Denied cardiovascular symptoms, arrhythmia, chest pain/pressure, edema, exercise intolerance, orthopnea and palpitations.  Respiratory: Denied pulmonary symptoms, asthma, pleuritic pain, productive sputum, cough, dyspnea and wheezing.  Gastrointestinal: Denied, gastro-esophageal reflux, melena, nausea and vomiting.  Genitourinary:  Denied genitourinary symptoms including symptomatic vaginal discharge, pelvic relaxation issues, and urinary complaints.  Musculoskeletal: Denied musculoskeletal symptoms, stiffness, swelling, muscle weakness and myalgia.  Dermatologic: Denied dermatology symptoms, rash and scar.  Neurologic: Denied neurology symptoms, dizziness, headache, neck pain and syncope.  Psychiatric: Denied psychiatric symptoms, anxiety and depression.  Endocrine: Denied endocrine symptoms including hot flashes and night sweats.   Medications    Patient's Medications  New Prescriptions   No medications on file  Previous Medications   IBUPROFEN (ADVIL) 800 MG TABLET    Take 1 tablet (800 mg total) by mouth every 8 (eight) hours as needed.   ONDANSETRON (ZOFRAN ODT) 4 MG  DISINTEGRATING TABLET    Take 1 tablet (4 mg total) by mouth every 6 (six) hours as needed for nausea or vomiting.   PANTOPRAZOLE (PROTONIX) 40 MG TABLET    Take 1 tablet (40 mg total) by mouth daily.  Modified Medications   No medications on file  Discontinued Medications   No medications on file   @ENCMED @   Objective: Vitals:   10/28/21 1717 10/28/21 2017  BP: 122/74 119/70  Pulse: 78 81  Resp: 20 20  Temp:    SpO2: 98% 100%      HEENT: Grossly within normal limits.  Normo-cephalic.  Neck Supple.  Pupils reactive.  Thyroid Smooth without nodularity or enlargement.  Skin No rashes, lesions or ulceration  Breasts: No masses or discharge.  Symmetric.  No axillary adenopathy.  Lungs: Clear to auscultation.  No rales or wheezes.  Heart: NSR.  No murmurs or rubs appreciated.  Abdomen: Soft.  Non-tender.  No masses.  No HSM. No rebound no guarding  Extremities: Moves all appropriately.  Normal ROM for age.  Neuro: Oriented to PPT.  Normal mood.    Pelvic: Deferred   [place]:          Right ovary   A 6.9 x 6.1 x 5.6 cm soft tissue structure in the RIGHT adnexal region is noted with more central area demonstrating internal vascular flow with arterial and venous waveforms and likely represents the RIGHT ovary. More peripheral aspect of this soft tissue structure may represent a small amount of adjacent complex fluid or blood but abnormal RIGHT ovary or mass is difficult to entirely exclude.   Left ovary   Not visualized   Pulsed Doppler evaluation of the RIGHT ovary demonstrates normal low-resistance arterial and venous waveforms within the central portion.  ASSESSMENT:  1.  Right-sided pelvic pain    2.  POSITIVE GC   PID? 3.  TOA on right?   Doubt torsion  PLAN: 1.  Admit to observation 2.  IV abx 3.  Pain relief  I spent 45 minutes involved in the care of this patient preparing to see the patient by obtaining and reviewing her medical history (including labs,  imaging tests and prior procedures), documenting clinical information in the electronic health record (EHR), counseling and coordinating care plans, writing and sending prescriptions, ordering tests or procedures and in direct communicating with the patient and medical staff discussing pertinent items from her history and physical exam.  Jeannie Fend ,MD 10/28/2021,9:08 PM

## 2021-10-28 NOTE — ED Triage Notes (Addendum)
Pt via POV from home. Pt c/o CP, SOB, lower abd pain, and vaginal bleeding for past couple of days. Pt states that she noticed the vaginal bleeding this AM and she said that is why she came in. Pt reports feeling, "hot all over". Denies any fevers. Pt has had her gallbladder removed. Pt is A&Ox4 and NAD.

## 2021-10-28 NOTE — ED Provider Notes (Signed)
[facility] Provider Note  Patient Contact: 4:07 PM (approximate)   History   Chest Pain and Abdominal Pain   HPI  Tracy Glover is a 31 y.o. female with a history of PID, bilateral tubal ligation, and ovarian cystectomy presents to the emergency department with right-sided pelvic pain that started 2 days ago.  Patient states that she has a history of ovarian cyst on the right.  She states that she has had some nausea but no vomiting.  She denies fever and chills.  She denies dysuria or increased urinary frequency.  She states that she did pass 2 small blood clots yesterday.  She states that her menstrual cycle ended a week ago.  She states that pain makes her feel breathless but she denies current chest pain or chest tightness.      Physical Exam   Triage Vital Signs: ED Triage Vitals  Enc Vitals Group     BP 10/28/21 1453 120/87     Pulse Rate 10/28/21 1453 (!) 107     Resp 10/28/21 1453 20     Temp 10/28/21 1453 98.5 F (36.9 C)     Temp Source 10/28/21 1453 Oral     SpO2 10/28/21 1453 96 %     Weight 10/28/21 1445 200 lb (90.7 kg)     Height 10/28/21 1445 5\' 6"  (1.676 m)     Head Circumference --      Peak Flow --      Pain Score 10/28/21 1445 10     Pain Loc --      Pain Edu? --      Excl. in GC? --     Most recent vital signs: Vitals:   10/28/21 1453  BP: 120/87  Pulse: (!) 107  Resp: 20  Temp: 98.5 F (36.9 C)  SpO2: 96%     General: Alert and in no acute distress. Eyes:  PERRL. EOMI: Head: No acute traumatic findings ENT:      Ears: TMs are pearly.       Nose: No congestion/rhinnorhea.      Mouth/Throat: Mucous membranes are moist. Neck: No stridor. No cervical spine tenderness to palpation. Cardiovascular:  Good peripheral perfusion Respiratory: Normal respiratory effort without tachypnea or retractions. Lungs CTAB. Good air entry to the bases with no decreased or absent breath sounds. Gastrointestinal: Bowel sounds  4 quadrants.  Patient has suprapubic and right lower quadrant tenderness to palpation.  No palpable masses. No distention. No CVA tenderness. Musculoskeletal: Full range of motion to all extremities.  Neurologic:  No gross focal neurologic deficits are appreciated.  Skin:   No rash noted Other:   ED Results / Procedures / Treatments   Labs (all labs ordered are listed, but only abnormal results are displayed) Labs Reviewed  CBC - Abnormal; Notable for the following components:      Result Value   Hemoglobin 11.7 (*)    All other components within normal limits  COMPREHENSIVE METABOLIC PANEL - Abnormal; Notable for the following components:   Sodium 134 (*)    Glucose, Bld 115 (*)    All other components within normal limits  CHLAMYDIA/NGC RT PCR ([facility] ONLY)            WET PREP, GENITAL  LIPASE, BLOOD  URINALYSIS, COMPLETE (UACMP) WITH MICROSCOPIC  POC URINE PREG, ED  TROPONIN I (HIGH SENSITIVITY)        RADIOLOGY  I personally viewed and evaluated these images as part of  my medical decision making, as well as reviewing the written report by the radiologist.  ED Provider Interpretation: I personally reviewed ultrasound and agree with radiologist interpretation.  Patient has a 6.9 cm soft tissue mass in the right adnexal region concerning for mass versus torsion.   PROCEDURES:  Critical Care performed: No  Procedures   MEDICATIONS ORDERED IN ED: Medications - No data to display   IMPRESSION / MDM / ASSESSMENT AND PLAN / ED COURSE  I reviewed the triage vital signs and the nursing notes.                              Differential diagnosis includes, but is not limited to, PID, appendicitis, ovarian cyst, hemorrhagic cyst, UTI, pyelonephritis..  Assessment and plan Pelvic pain PID  Ovarian mass 31 year old female presents to the emergency department with suprapubic and right lower quadrant abdominal pain as well as nausea for the past 2 days.  Patient was  mildly tachycardic at triage but vital signs were otherwise reassuring.  Patient seemed very uncomfortable on physical exam.  She did have right lower quadrant abdominal tenderness and suprapubic tenderness to palpation with some guarding.  Will obtain dedicated pelvic ultrasound with torsion rule out torsion and abscess considering patient's extensive history of PID and will reassess.  Dedicated ultrasound shows a 6.9 soft tissue mass in the right adnexa with possible intermittent torsion.  CBC, CMP and lipase within range.  Urinalysis concerning for UTI with large blood, moderate leukocytes and greater than 50 white blood cells.  Urine culture is in process.  Patient did have cervical motion tenderness on pelvic exam.  Lab ran a gonorrhea and Chlamydia test on urinalysis and it returned positive for gonorrhea.  I did repeat gonorrhea and Chlamydia vaginal testing as well as wet prep which was concerning for clue cells.  I reached out to OB/GYN on-call, Dr. Dalbert Garnet.  Patient is established patient with [facility].  We will proceed with paging Dr. Logan Bores who is covering call for Dr. Tiburcio Pea.  Spoke with Dr. Tiburcio Pea regarding patient case.  Very much appreciate time and consult.  Dr. Tiburcio Pea is concerned about abscess from PID and plans to admit.  He will personally evaluate patient at bedside.  Patient was given IV Rocephin in the emergency department.  Clinical Course as of 10/28/21 1949  Wynelle Link Oct 28, 2021  1944 SARS Coronavirus 2 by RT PCR: NEGATIVE [JW]    Clinical Course User Index [JW] Orvil Feil, PA-C     FINAL CLINICAL IMPRESSION(S) / ED DIAGNOSES   Final diagnoses:  Pelvic pain     Rx / DC Orders   ED Discharge Orders     None        Note:  This document was prepared using Dragon voice recognition software and may include unintentional dictation errors.   Pia Mau [place], PA-C 10/28/21 2222    Dionne Bucy, MD 10/28/21 2330

## 2021-10-28 NOTE — ED Notes (Addendum)
This nurse and provider at bedside to complete vaginal exam to collect specimens. Patient tolerated exam well. Specimens collected and sent to lab. This nurse noted outstanding Troponin and spoke to provider. She stated that it wasn't necessarily needed but I suggested waiting until patient is seen by OB/GYN. If patient is admitted or sent to the OR, I will collect it then. Provider is ok with this.

## 2021-10-29 DIAGNOSIS — N80121 Deep endometriosis of right ovary: Secondary | ICD-10-CM | POA: Diagnosis present

## 2021-10-29 DIAGNOSIS — F17210 Nicotine dependence, cigarettes, uncomplicated: Secondary | ICD-10-CM | POA: Diagnosis present

## 2021-10-29 DIAGNOSIS — R1031 Right lower quadrant pain: Secondary | ICD-10-CM | POA: Diagnosis present

## 2021-10-29 DIAGNOSIS — Z20822 Contact with and (suspected) exposure to covid-19: Secondary | ICD-10-CM | POA: Diagnosis present

## 2021-10-29 DIAGNOSIS — N7093 Salpingitis and oophoritis, unspecified: Secondary | ICD-10-CM | POA: Diagnosis not present

## 2021-10-29 DIAGNOSIS — A549 Gonococcal infection, unspecified: Secondary | ICD-10-CM | POA: Diagnosis present

## 2021-10-29 LAB — CBC WITH DIFFERENTIAL/PLATELET
Abs Immature Granulocytes: 0.01 10*3/uL (ref 0.00–0.07)
Basophils Absolute: 0 10*3/uL (ref 0.0–0.1)
Basophils Relative: 1 %
Eosinophils Absolute: 0.7 10*3/uL — ABNORMAL HIGH (ref 0.0–0.5)
Eosinophils Relative: 11 %
HCT: 32 % — ABNORMAL LOW (ref 36.0–46.0)
Hemoglobin: 10.2 g/dL — ABNORMAL LOW (ref 12.0–15.0)
Immature Granulocytes: 0 %
Lymphocytes Relative: 25 %
Lymphs Abs: 1.5 10*3/uL (ref 0.7–4.0)
MCH: 28.9 pg (ref 26.0–34.0)
MCHC: 31.9 g/dL (ref 30.0–36.0)
MCV: 90.7 fL (ref 80.0–100.0)
Monocytes Absolute: 0.6 10*3/uL (ref 0.1–1.0)
Monocytes Relative: 11 %
Neutro Abs: 3.1 10*3/uL (ref 1.7–7.7)
Neutrophils Relative %: 52 %
Platelets: 301 10*3/uL (ref 150–400)
RBC: 3.53 MIL/uL — ABNORMAL LOW (ref 3.87–5.11)
RDW: 14.1 % (ref 11.5–15.5)
WBC: 5.9 10*3/uL (ref 4.0–10.5)
nRBC: 0 % (ref 0.0–0.2)

## 2021-10-29 NOTE — Progress Notes (Addendum)
Subjective:    Patient was sleeping comfortably.  She states her pain is some better this morning.  She does report that she had pain through the night but the Norco took the pain away.  Objective:    Abdomen:  Soft, moderately tender to palpation especially on the right.  Patient has some guarding.  No rebound tenderness.  Labs: Results for orders placed or performed during the hospital encounter of 10/28/21 (from the past 24 hour(s))  CBC     Status: Abnormal   Collection Time: 10/28/21  2:47 PM  Result Value Ref Range   WBC 7.7 4.0 - 10.5 K/uL   RBC 4.15 3.87 - 5.11 MIL/uL   Hemoglobin 11.7 (L) 12.0 - 15.0 g/dL   HCT 33.8 32.9 - 19.1 %   MCV 89.2 80.0 - 100.0 fL   MCH 28.2 26.0 - 34.0 pg   MCHC 31.6 30.0 - 36.0 g/dL   RDW 66.0 60.0 - 45.9 %   Platelets 381 150 - 400 K/uL   nRBC 0.0 0.0 - 0.2 %  Troponin I (High Sensitivity)     Status: None   Collection Time: 10/28/21  2:47 PM  Result Value Ref Range   Troponin I (High Sensitivity) <2 <18 ng/L  Lipase, blood     Status: None   Collection Time: 10/28/21  2:47 PM  Result Value Ref Range   Lipase 33 11 - 51 U/L  hCG, quantitative, pregnancy     Status: None   Collection Time: 10/28/21  2:47 PM  Result Value Ref Range   hCG, Beta Chain, Quant, S <1 <5 mIU/mL  Comprehensive metabolic panel     Status: Abnormal   Collection Time: 10/28/21  2:50 PM  Result Value Ref Range   Sodium 134 (L) 135 - 145 mmol/L   Potassium 3.5 3.5 - 5.1 mmol/L   Chloride 100 98 - 111 mmol/L   CO2 24 22 - 32 mmol/L   Glucose, Bld 115 (H) 70 - 99 mg/dL   BUN 10 6 - 20 mg/dL   Creatinine, Ser 9.77 0.44 - 1.00 mg/dL   Calcium 9.3 8.9 - 41.4 mg/dL   Total Protein 8.0 6.5 - 8.1 g/dL   Albumin 3.9 3.5 - 5.0 g/dL   AST 18 15 - 41 U/L   ALT 17 0 - 44 U/L   Alkaline Phosphatase 63 38 - 126 U/L   Total Bilirubin 0.6 0.3 - 1.2 mg/dL   GFR, Estimated >23 >95 mL/min   Anion gap 10 5 - 15  Urinalysis, Complete w Microscopic     Status: Abnormal    Collection Time: 10/28/21  5:23 PM  Result Value Ref Range   Color, Urine AMBER (A) YELLOW   APPearance CLOUDY (A) CLEAR   Specific Gravity, Urine 1.033 (H) 1.005 - 1.030   pH 5.0 5.0 - 8.0   Glucose, UA NEGATIVE NEGATIVE mg/dL   Hgb urine dipstick LARGE (A) NEGATIVE   Bilirubin Urine NEGATIVE NEGATIVE   Ketones, ur 5 (A) NEGATIVE mg/dL   Protein, ur 30 (A) NEGATIVE mg/dL   Nitrite NEGATIVE NEGATIVE   Leukocytes,Ua MODERATE (A) NEGATIVE   RBC / HPF 21-50 0 - 5 RBC/hpf   WBC, UA >50 (H) 0 - 5 WBC/hpf   Bacteria, UA NONE SEEN NONE SEEN   Squamous Epithelial / LPF 21-50 0 - 5   Mucus PRESENT   Chlamydia/NGC rt PCR ([facility] only)     Status: Abnormal   Collection Time: 10/28/21  5:23 PM   Specimen: Cervical/Vaginal swab  Result Value Ref Range   Specimen source GC/Chlam ENDOCERVICAL    Chlamydia Tr NOT DETECTED NOT DETECTED   N gonorrhoeae DETECTED (A) NOT DETECTED  Wet prep, genital     Status: Abnormal   Collection Time: 10/28/21  5:23 PM  Result Value Ref Range   Yeast Wet Prep HPF POC NONE SEEN NONE SEEN   Trich, Wet Prep NONE SEEN NONE SEEN   Clue Cells Wet Prep HPF POC PRESENT (A) NONE SEEN   WBC, Wet Prep HPF POC >=10 (A) <10   Sperm NONE SEEN   POC urine preg, ED     Status: None   Collection Time: 10/28/21  5:28 PM  Result Value Ref Range   Preg Test, Ur NEGATIVE NEGATIVE  Resp Panel by RT-PCR (Flu A&B, Covid) Nasopharyngeal Swab     Status: None   Collection Time: 10/28/21  5:59 PM   Specimen: Nasopharyngeal Swab; Nasopharyngeal(NP) swabs in vial transport medium  Result Value Ref Range   SARS Coronavirus 2 by RT PCR NEGATIVE NEGATIVE   Influenza A by PCR NEGATIVE NEGATIVE   Influenza B by PCR NEGATIVE NEGATIVE  Chlamydia/NGC rt PCR ([facility] only)     Status: Abnormal   Collection Time: 10/28/21  7:50 PM   Specimen: Cervical/Vaginal swab  Result Value Ref Range   Specimen source GC/Chlam ENDOCERVICAL    Chlamydia Tr NOT DETECTED NOT DETECTED   N gonorrhoeae DETECTED  (A) NOT DETECTED  Urine Drug Screen, Qualitative ([facility] only)     Status: Abnormal   Collection Time: 10/28/21  9:30 PM  Result Value Ref Range   Tricyclic, Ur Screen NONE DETECTED NONE DETECTED   Amphetamines, Ur Screen NONE DETECTED NONE DETECTED   MDMA (Ecstasy)Ur Screen NONE DETECTED NONE DETECTED   Cocaine Metabolite,Ur [place] POSITIVE (A) NONE DETECTED   Opiate, Ur Screen POSITIVE (A) NONE DETECTED   Phencyclidine (PCP) Ur S NONE DETECTED NONE DETECTED   Cannabinoid 50 Ng, Ur [place] POSITIVE (A) NONE DETECTED   Barbiturates, Ur Screen NONE DETECTED NONE DETECTED   Benzodiazepine, Ur Scrn NONE DETECTED NONE DETECTED   Methadone Scn, Ur NONE DETECTED NONE DETECTED  CBC with Differential/Platelet     Status: Abnormal   Collection Time: 10/29/21  5:53 AM  Result Value Ref Range   WBC 5.9 4.0 - 10.5 K/uL   RBC 3.53 (L) 3.87 - 5.11 MIL/uL   Hemoglobin 10.2 (L) 12.0 - 15.0 g/dL   HCT 32.0 (L) 36.0 - 46.0 %   MCV 90.7 80.0 - 100.0 fL   MCH 28.9 26.0 - 34.0 pg   MCHC 31.9 30.0 - 36.0 g/dL   RDW 14.1 11.5 - 15.5 %   Platelets 301 150 - 400 K/uL   nRBC 0.0 0.0 - 0.2 %   Neutrophils Relative % 52 %   Neutro Abs 3.1 1.7 - 7.7 K/uL   Lymphocytes Relative 25 %   Lymphs Abs 1.5 0.7 - 4.0 K/uL   Monocytes Relative 11 %   Monocytes Absolute 0.6 0.1 - 1.0 K/uL   Eosinophils Relative 11 %   Eosinophils Absolute 0.7 (H) 0.0 - 0.5 K/uL   Basophils Relative 1 %   Basophils Absolute 0.0 0.0 - 0.1 K/uL   Immature Granulocytes 0 %   Abs Immature Granulocytes 0.01 0.00 - 0.07 K/uL    Medications    Current Discharge Medication List     CONTINUE these medications which have NOT CHANGED   Details  ibuprofen (ADVIL) 800 MG tablet Take 1 tablet (800 mg total) by mouth every 8 (eight) hours as needed. Qty: 30 tablet, Refills: 0    ondansetron (ZOFRAN ODT) 4 MG disintegrating tablet Take 1 tablet (4 mg total) by mouth every 6 (six) hours as needed for nausea or vomiting. Qty: 30 tablet, Refills: 0     pantoprazole (PROTONIX) 40 MG tablet Take 1 tablet (40 mg total) by mouth daily. Qty: 30 tablet, Refills: 1          Assessment:    PID-likely TOA-doubt torsion  Pain slowly improving  Plan:    Continue IV antibiotics  Manage pain  I spent 25 minutes involved in the care of this patient preparing to see the patient by obtaining and reviewing her medical history (including labs, imaging tests and prior procedures), documenting clinical information in the electronic health record (EHR), counseling and coordinating care plans, writing and sending prescriptions, ordering tests or procedures and in direct communicating with the patient and medical staff discussing pertinent items from her history and physical exam.  Finis Bud, M.D. 10/29/2021 7:55 AM

## 2021-10-30 ENCOUNTER — Inpatient Hospital Stay: Payer: Medicaid Other

## 2021-10-30 DIAGNOSIS — N7093 Salpingitis and oophoritis, unspecified: Secondary | ICD-10-CM

## 2021-10-30 LAB — URINE CULTURE

## 2021-10-30 MED ORDER — ONDANSETRON HCL 4 MG/2ML IJ SOLN
4.0000 mg | Freq: Four times a day (QID) | INTRAMUSCULAR | Status: DC | PRN
  Administered 2021-10-30: 4 mg via INTRAVENOUS
  Filled 2021-10-30: qty 2

## 2021-10-30 MED ORDER — GADOBUTROL 1 MMOL/ML IV SOLN
9.0000 mL | Freq: Once | INTRAVENOUS | Status: AC | PRN
  Administered 2021-10-30: 9 mL via INTRAVENOUS

## 2021-10-30 NOTE — Progress Notes (Signed)
Sent secure chat to Dr. Valentino Saxon with MRI results.

## 2021-10-30 NOTE — Progress Notes (Signed)
Patient off floor MRI

## 2021-10-30 NOTE — Progress Notes (Signed)
Patient ID: Tracy Glover, female   DOB: 29-Dec-1990, 29 y.o.   MRN: [id number]     Subjective:    She says she feels better and would "consider" going home.  Still c/o right-sided and midline pain. Says Norco is helping.  Objective:    Patient Vitals for the past 2 hrs:  BP Temp Temp src Pulse Resp SpO2  10/30/21 0740 99/61 98.2 F (36.8 C) Oral 60 17 100 %   No intake/output data recorded.  Labs: No results found for this or any previous visit (from the past 24 hour(s)).  Medications    Current Discharge Medication List     CONTINUE these medications which have NOT CHANGED   Details  ibuprofen (ADVIL) 800 MG tablet Take 1 tablet (800 mg total) by mouth every 8 (eight) hours as needed. Qty: 30 tablet, Refills: 0    ondansetron (ZOFRAN ODT) 4 MG disintegrating tablet Take 1 tablet (4 mg total) by mouth every 6 (six) hours as needed for nausea or vomiting. Qty: 30 tablet, Refills: 0    pantoprazole (PROTONIX) 40 MG tablet Take 1 tablet (40 mg total) by mouth daily. Qty: 30 tablet, Refills: 1         Abd:     Soft - pt still guarding.  +- rebound tenderness  Nl bowel sounds Assessment:    I reviewed [place] with radiology and they also are leaning toward abcess but recommend MRI for further diagnosis and possible IR drainage.  Plan:    1.  Discussed MRI with patient - will order today.  2.  IR tomorrow if possible based on appearance and diagnosis  3.  NPO after MN in anticipation of possible CT guided IR  I spent 33 minutes involved in the care of this patient preparing to see the patient by obtaining and reviewing her medical history (including labs, imaging tests and prior procedures), documenting clinical information in the electronic health record (EHR), counseling and coordinating care plans, writing and sending prescriptions, ordering tests or procedures and in direct communicating with the patient and medical staff discussing pertinent items from her history and  physical exam.  Elonda Husky, M.D. 10/30/2021 9:30 AM

## 2021-10-31 ENCOUNTER — Encounter: Payer: Self-pay | Admitting: Obstetrics and Gynecology

## 2021-10-31 MED ORDER — IBUPROFEN 600 MG PO TABS: 600.0000 mg | ORAL_TABLET | Freq: Four times a day (QID) | ORAL | 0 refills | Status: DC | PRN

## 2021-10-31 MED ORDER — HYDROCODONE-ACETAMINOPHEN 5-325 MG PO TABS: 1.0000 | ORAL_TABLET | ORAL | 0 refills | Status: DC | PRN

## 2021-10-31 MED ORDER — IBUPROFEN 600 MG PO TABS
600.0000 mg | ORAL_TABLET | Freq: Four times a day (QID) | ORAL | Status: DC | PRN
  Administered 2021-10-31: 600 mg via ORAL
  Filled 2021-10-31: qty 1

## 2021-10-31 NOTE — Discharge Summary (Signed)
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  She reports that it has improved since admission but not gone.  Reports that medications are giving her pain control. She is ambulating well. She is taking PO well. Her pain is well controlled with her current medications. She is urinating without difficulty and is passing flatus.   Objective:  BP 123/83 (BP Location: Left Arm)    Pulse 74    Temp 98 F (36.7 C) (Oral)    Resp 20    Ht 5\' 6"  (1.676 m)    Wt 90.7 kg    LMP 10/22/2021 (Approximate)    SpO2 99%    BMI 32.28 kg/m     Abdomen:                           Has mild-mod RLQ abd pain    Assessment:   Probable endometrioma from MRI - doubt malignancy   Not likely TOA    Plan:        Discharge home.                       Medications as directed.   Exploratory LPY scheduled for Friday.    Pelvic mass The patient and I have reviewed the option of surgery and have discussed the differential diagnosis of a pelvic mass in someone her age.  We have discussed the possibility of oophorectomy, both unilateral and bilateral.  We have also discussed the possibility of accompanying abdominal hysterectomy, should this be deemed necessary at the time of surgery.  Although the chance of malignancy is small, this has also been discussed with the patient and the possibility of lymph node dissection or more extensive debulking surgery has been discussed in detail.  The possibility of another surgeon being called to perform other additional procedures, should they be  necessary, has been discussed.  In addition to the above noted complications of a pelvic mass, we have also discussed the general complications found at any surgical procedure including bleeding, infection, damage to bowel, bladder, ureters or other internal organ, and the risk of anesthesia.  The possible conversion of the case from laparoscopic to laparotomy was also discussed in detail.  The above risks were specifically discussed, but the patient has been informed her complications may not be limited to them.  I have answered all of the patients questions and I believe she has an informed understanding of the surgery for pelvic mass.   Hospital Course:  Pt stable but pain not decreasing remarkably.   Condition at Discharge:  good Discharge Medications:  Allergies as of 10/31/2021   No Known Allergies      Medication List     STOP taking these medications  ondansetron 4 MG disintegrating tablet Commonly known as: Zofran ODT   pantoprazole 40 MG tablet Commonly known as: Protonix       TAKE these medications    HYDROcodone-acetaminophen 5-325 MG tablet Commonly known as: NORCO/VICODIN Take 1-2 tablets by mouth every 4 (four) hours as needed for moderate pain.   ibuprofen 600 MG tablet Commonly known as: ADVIL Take 1 tablet (600 mg total) by mouth every 6 (six) hours as needed for moderate pain. What changed:  medication strength how much to take when to take this reasons to take this         Follow Up:    Follow-up Information     Linzie Collin, MD Follow up.   Specialties: Obstetrics and Gynecology, Radiology Why: scheduled for surgery on Friday Contact information: [address] [place] [address] [phone]                I spent 42 minutes involved in the care of this patient preparing to see the patient by obtaining and reviewing her medical history (including labs, imaging tests and prior procedures), documenting  clinical information in the electronic health record (EHR), counseling and coordinating care plans, writing and sending prescriptions, ordering tests or procedures and in direct communicating with the patient and medical staff discussing pertinent items from her history and physical exam.  Elonda Husky, M.D. 10/31/2021 1:23 PM

## 2021-11-01 ENCOUNTER — Other Ambulatory Visit: Payer: Self-pay | Admitting: Obstetrics and Gynecology

## 2021-11-01 ENCOUNTER — Inpatient Hospital Stay: Admission: RE | Admit: 2021-11-01 | Payer: Medicaid Other | Source: Ambulatory Visit

## 2021-11-01 DIAGNOSIS — R102 Pelvic and perineal pain: Secondary | ICD-10-CM

## 2021-11-01 DIAGNOSIS — N83299 Other ovarian cyst, unspecified side: Secondary | ICD-10-CM

## 2021-11-02 ENCOUNTER — Encounter: Payer: Self-pay | Admitting: Obstetrics and Gynecology

## 2021-11-02 ENCOUNTER — Encounter: Payer: Self-pay | Admitting: Anesthesiology

## 2021-11-02 ENCOUNTER — Ambulatory Visit
Admission: RE | Admit: 2021-11-02 | Discharge: 2021-11-02 | Disposition: A | Discharge status: Home / Self Care | Payer: Medicaid Other | Attending: Obstetrics and Gynecology | Admitting: Obstetrics and Gynecology

## 2021-11-02 ENCOUNTER — Encounter
Admission: RE | Disposition: A | Discharge status: Home / Self Care | Payer: Self-pay | Source: Home / Self Care | Attending: Obstetrics and Gynecology

## 2021-11-02 DIAGNOSIS — R102 Pelvic and perineal pain: Secondary | ICD-10-CM | POA: Diagnosis not present

## 2021-11-02 DIAGNOSIS — Z538 Procedure and treatment not carried out for other reasons: Secondary | ICD-10-CM | POA: Diagnosis not present

## 2021-11-02 DIAGNOSIS — N83299 Other ovarian cyst, unspecified side: Secondary | ICD-10-CM | POA: Insufficient documentation

## 2021-11-02 DIAGNOSIS — R825 Elevated urine levels of drugs, medicaments and biological substances: Secondary | ICD-10-CM

## 2021-11-02 LAB — POCT PREGNANCY, URINE: Preg Test, Ur: NEGATIVE

## 2021-11-02 LAB — CBC
HCT: 35.1 % — ABNORMAL LOW (ref 36.0–46.0)
Hemoglobin: 11.4 g/dL — ABNORMAL LOW (ref 12.0–15.0)
MCH: 29.1 pg (ref 26.0–34.0)
MCHC: 32.5 g/dL (ref 30.0–36.0)
MCV: 89.5 fL (ref 80.0–100.0)
Platelets: 393 10*3/uL (ref 150–400)
RBC: 3.92 MIL/uL (ref 3.87–5.11)
RDW: 13.7 % (ref 11.5–15.5)
WBC: 11.2 10*3/uL — ABNORMAL HIGH (ref 4.0–10.5)
nRBC: 0 % (ref 0.0–0.2)

## 2021-11-02 LAB — URINE DRUG SCREEN, QUALITATIVE (ARMC ONLY)
Amphetamines, Ur Screen: POSITIVE — AB
Barbiturates, Ur Screen: NOT DETECTED
Benzodiazepine, Ur Scrn: NOT DETECTED
Cannabinoid 50 Ng, Ur ~~LOC~~: POSITIVE — AB
Cocaine Metabolite,Ur ~~LOC~~: POSITIVE — AB
MDMA (Ecstasy)Ur Screen: NOT DETECTED
Methadone Scn, Ur: NOT DETECTED
Opiate, Ur Screen: POSITIVE — AB
Phencyclidine (PCP) Ur S: NOT DETECTED
Tricyclic, Ur Screen: NOT DETECTED

## 2021-11-02 LAB — TYPE AND SCREEN
ABO/RH(D): O POS
Antibody Screen: NEGATIVE

## 2021-11-02 SURGERY — OOPHORECTOMY, LAPAROSCOPIC
Anesthesia: General | Laterality: Right

## 2021-11-02 MED ORDER — POVIDONE-IODINE 10 % EX SWAB: 2.0000 "application " | Freq: Once | CUTANEOUS | Status: DC

## 2021-11-02 MED ORDER — LACTATED RINGERS IV SOLN: INTRAVENOUS | Status: DC

## 2021-11-02 MED ORDER — CHLORHEXIDINE GLUCONATE 0.12 % MT SOLN: 15.0000 mL | Freq: Once | OROMUCOSAL | Status: DC

## 2021-11-02 MED ORDER — CHLORHEXIDINE GLUCONATE 0.12 % MT SOLN
OROMUCOSAL | Status: AC
  Filled 2021-11-02: qty 15

## 2021-11-02 MED ORDER — ORAL CARE MOUTH RINSE: 15.0000 mL | Freq: Once | OROMUCOSAL | Status: DC

## 2021-11-02 MED ORDER — FAMOTIDINE 20 MG PO TABS
ORAL_TABLET | ORAL | Status: AC
  Filled 2021-11-02: qty 1

## 2021-11-02 MED ORDER — BUPIVACAINE HCL (PF) 0.5 % IJ SOLN
INTRAMUSCULAR | Status: AC
  Filled 2021-11-02: qty 30

## 2021-11-02 SURGICAL SUPPLY — 42 items
BACTOSHIELD CHG 4% 4OZ (MISCELLANEOUS) ×1
BAG DECANTER FOR FLEXI CONT (MISCELLANEOUS) ×3 IMPLANT
BLADE SURG 15 STRL LF DISP TIS (BLADE) ×2 IMPLANT
BLADE SURG 15 STRL SS (BLADE) ×1
BLADE SURG SZ11 CARB STEEL (BLADE) ×3 IMPLANT
CATH ROBINSON RED A/P 16FR (CATHETERS) ×3 IMPLANT
CHLORAPREP W/TINT 26 (MISCELLANEOUS) ×3 IMPLANT
ELECT REM PT RETURN 9FT ADLT (ELECTROSURGICAL) ×3
ELECTRODE REM PT RTRN 9FT ADLT (ELECTROSURGICAL) ×2 IMPLANT
GAUZE 4X4 16PLY ~~LOC~~+RFID DBL (SPONGE) ×3 IMPLANT
GLOVE SURG ENC MOIS LTX SZ8 (GLOVE) ×3 IMPLANT
GLOVE SURG POLY ORTHO LF SZ7.5 (GLOVE) ×3 IMPLANT
GOWN STRL REUS W/ TWL LRG LVL3 (GOWN DISPOSABLE) ×4 IMPLANT
GOWN STRL REUS W/TWL LRG LVL3 (GOWN DISPOSABLE) ×2
GOWN STRL REUS W/TWL XL LVL4 (GOWN DISPOSABLE) ×3 IMPLANT
IRRIGATION STRYKERFLOW (MISCELLANEOUS) IMPLANT
IRRIGATOR STRYKERFLOW (MISCELLANEOUS)
IV LACTATED RINGERS 1000ML (IV SOLUTION) ×3 IMPLANT
KIT PINK PAD W/HEAD ARE REST (MISCELLANEOUS) ×3
KIT PINK PAD W/HEAD ARM REST (MISCELLANEOUS) ×2 IMPLANT
KIT TURNOVER CYSTO (KITS) ×3 IMPLANT
LIGASURE LAP MARYLAND 5MM 37CM (ELECTROSURGICAL) IMPLANT
MANIFOLD NEPTUNE II (INSTRUMENTS) ×3 IMPLANT
NEEDLE HYPO 22GX1.5 SAFETY (NEEDLE) ×3 IMPLANT
NS IRRIG 500ML POUR BTL (IV SOLUTION) ×3 IMPLANT
PACK GYN LAPAROSCOPIC (MISCELLANEOUS) ×3 IMPLANT
PAD OB MATERNITY 4.3X12.25 (PERSONAL CARE ITEMS) ×3 IMPLANT
PAD PREP 24X41 OB/GYN DISP (PERSONAL CARE ITEMS) ×3 IMPLANT
SCRUB CHG 4% DYNA-HEX 4OZ (MISCELLANEOUS) ×2 IMPLANT
SET TUBE SMOKE EVAC HIGH FLOW (TUBING) ×3 IMPLANT
SLEEVE ENDOPATH XCEL 5M (ENDOMECHANICALS) IMPLANT
STRIP CLOSURE SKIN 1/2X4 (GAUZE/BANDAGES/DRESSINGS) ×3 IMPLANT
SUT VICRYL 0 AB UR-6 (SUTURE) ×3 IMPLANT
SUT VICRYL 4-0  27 PS-2 BARIAT (SUTURE) ×1
SUT VICRYL 4-0 27 PS-2 BARIAT (SUTURE) ×2
SUTURE VICRYL 4-0 27 PS-2 BART (SUTURE) ×2 IMPLANT
TOWEL OR 17X26 4PK STRL BLUE (TOWEL DISPOSABLE) ×3 IMPLANT
TROCAR ENDO BLADELESS 11MM (ENDOMECHANICALS) IMPLANT
TROCAR XCEL NON-BLD 5MMX100MML (ENDOMECHANICALS) ×3 IMPLANT
TROCAR XCEL UNIV SLVE 11M 100M (ENDOMECHANICALS) IMPLANT
TUBING CONNECTING 10 (TUBING) ×3 IMPLANT
WATER STERILE IRR 500ML POUR (IV SOLUTION) ×3 IMPLANT

## 2021-11-02 NOTE — Anesthesia Preprocedure Evaluation (Addendum)
Anesthesia Evaluation  Patient identified by MRN, date of birth, ID band Patient awake    Reviewed: Allergy & Precautions, NPO status , Patient's Chart, lab work & pertinent test results  Airway Mallampati: III  TM Distance: >3 FB Neck ROM: full    Dental  (+) Chipped   Pulmonary neg pulmonary ROS, Current Smoker and Patient abstained from smoking.,    Pulmonary exam normal        Cardiovascular negative cardio ROS Normal cardiovascular exam     Neuro/Psych negative neurological ROS  negative psych ROS   GI/Hepatic negative GI ROS, (+)     substance abuse  cocaine use and marijuana use,   Endo/Other  negative endocrine ROS  Renal/GU      Musculoskeletal  (+) narcotic dependent  Abdominal   Peds  Hematology negative hematology ROS (+)   Anesthesia Other Findings tubo-ovarian abcess Opioid Use  Past Medical History: No date: Cholecystitis 2016: Hx of gallstones No date: Polysubstance abuse (HCC)  Past Surgical History: No date: CESAREAN SECTION 05/07/2016: CESAREAN SECTION WITH BILATERAL TUBAL LIGATION; N/A     Comment:  Procedure: CESAREAN SECTION WITH RIGHT TUBAL LIGATION;                Surgeon: Vena Austria, MD;  Location: [facility] ORS;                Service: Obstetrics;  Laterality: N/A; 05/19/2015: CHOLECYSTECTOMY; N/A     Comment:  Procedure: LAPAROSCOPIC CHOLECYSTECTOMY;  Surgeon: Natale Lay, MD;  Location: [facility] ORS;  Service: General;                Laterality: N/A;     Reproductive/Obstetrics negative OB ROS                            Anesthesia Physical Anesthesia Plan  ASA:   Anesthesia Plan: General ETT   Post-op Pain Management:    Induction:   PONV Risk Score and Plan: Ondansetron, Dexamethasone, Midazolam and Treatment may vary due to age or medical condition  Airway Management Planned: Oral ETT  Additional Equipment:   Intra-op Plan:    Post-operative Plan:   Informed Consent:     Dental Advisory Given  Plan Discussed with: Anesthesiologist, CRNA and Surgeon  Anesthesia Plan Comments: (Pt was positive for cocaine and amphetamines. She was recently discharged home with opioids. She denies use since the 19th. The case is canceled as it is not emergent and I recommend rescheduling for 3-5 days from now with avoidance of substance use. )       Anesthesia Quick Evaluation

## 2021-11-02 NOTE — Progress Notes (Signed)
Anesthesia and surgeon in to speak with patient about need to R/S surgery in regard to UDS results.  Patient given surgeon's office number and instructed to call to schedule appointment in office or R/S of surgery.  Instructed not to use any recreational drugs until after surgery is completed.  Instructed to only take medications that have been prescribed for her.  Verbalized understanding but also verbalized discouragement over not being able to have surgery done today and concerns for her own safety and the amount of pain that she is having.  Reinforced to her that at this time the surgeon did not feel that it was an emergency that would necessitate surgery to be done today but that it should be done as soon as possible-that is why it would be best if she just did not use any type of recreational drug from this moment until after surgery can be scheduled and completed.

## 2021-11-09 ENCOUNTER — Encounter: Payer: Medicaid Other | Admitting: Obstetrics and Gynecology

## 2021-12-19 ENCOUNTER — Encounter: Payer: Self-pay | Admitting: Obstetrics and Gynecology

## 2021-12-19 ENCOUNTER — Ambulatory Visit (INDEPENDENT_AMBULATORY_CARE_PROVIDER_SITE_OTHER): Payer: Medicaid Other | Admitting: Obstetrics and Gynecology

## 2021-12-19 VITALS — BP 105/73 | HR 85 | Ht 66.0 in | Wt 201.7 lb

## 2021-12-19 DIAGNOSIS — R1900 Intra-abdominal and pelvic swelling, mass and lump, unspecified site: Secondary | ICD-10-CM | POA: Diagnosis not present

## 2021-12-19 DIAGNOSIS — Z01818 Encounter for other preprocedural examination: Secondary | ICD-10-CM

## 2021-12-19 NOTE — Progress Notes (Signed)
HPI: ?     [name] is a 31 y.o. [phone] who LMP was Patient's last menstrual period was 11/26/2021 (exact date). ? ?Subjective:  ? ?She presents today stating that her pain is now resolved and has been so for over a month.  She is considering future surgery so that this does not happen again and wants to know what to do for management at this time. ?Of significant note patient has a bilateral tubal ligation for birth control. ? ?  Hx: ?The following portions of the patient's history were reviewed and updated as appropriate: ?            She  has a past medical history of Cholecystitis, gallstones (2016), and Polysubstance abuse (HCC). ?She does not have any pertinent problems on file. ?She  has a past surgical history that includes Cholecystectomy (N/A, 05/19/2015); Cesarean section; and Cesarean section with bilateral tubal ligation (N/A, 05/07/2016). ?Her family history includes Cancer in her mother; Kidney disease in her father. ?She  reports that she has been smoking cigarettes. She has been smoking an average of .25 packs per day. She has never used smokeless tobacco. She reports current alcohol use. She reports current drug use. Drugs: Marijuana and Cocaine. ?She currently has no medications in their medication list. ?She is allergic to morphine and related and latex. ?      ?Review of Systems:  ?Review of Systems ? ?Constitutional: Denied constitutional symptoms, night sweats, recent illness, fatigue, fever, insomnia and weight loss.  ?Eyes: Denied eye symptoms, eye pain, photophobia, vision change and visual disturbance.  ?Ears/Nose/Throat/Neck: Denied ear, nose, throat or neck symptoms, hearing loss, nasal discharge, sinus congestion and sore throat.  ?Cardiovascular: Denied cardiovascular symptoms, arrhythmia, chest pain/pressure, edema, exercise intolerance, orthopnea and palpitations.  ?Respiratory: Denied pulmonary symptoms, asthma, pleuritic pain, productive sputum, cough, dyspnea and  wheezing.  ?Gastrointestinal: Denied, gastro-esophageal reflux, melena, nausea and vomiting.  ?Genitourinary: Denied genitourinary symptoms including symptomatic vaginal discharge, pelvic relaxation issues, and urinary complaints.  ?Musculoskeletal: Denied musculoskeletal symptoms, stiffness, swelling, muscle weakness and myalgia.  ?Dermatologic: Denied dermatology symptoms, rash and scar.  ?Neurologic: Denied neurology symptoms, dizziness, headache, neck pain and syncope.  ?Psychiatric: Denied psychiatric symptoms, anxiety and depression.  ?Endocrine: Denied endocrine symptoms including hot flashes and night sweats.  ? ?Meds: ?  ?No current outpatient medications on file prior to visit.  ? ?No current facility-administered medications on file prior to visit.  ? ? ? ? ?Objective:  ?  ? ?Vitals:  ? 12/19/21 1529  ?BP: 105/73  ?Pulse: 85  ? ?Filed Weights  ? 12/19/21 1529  ?Weight: 201 lb 11.2 oz (91.5 kg)  ? ?  ?          ?        ? ?Assessment:  ?  ?[id number] ?Patient Active Problem List  ? Diagnosis Date Noted  ? TOA (tubo-ovarian abscess) 10/28/2021  ? Postpartum care following cesarean delivery 03/24/2016  ? Cardiac syncope 09/28/2015  ? Acute cholecystitis 05/18/2015  ? ?  ?1. Pelvic mass in female   ? ? Probable TOA on the right which at this time has seemingly resolved by patient's symptoms.  ? ? ?Plan:  ?  ?       ? 1.  Recommend follow-up ultrasound for complex adnexal structure on the right. ?  ?Orders ?Orders Placed This Encounter  ?Procedures  ? US PELVIS (TRANSABDOMINAL ONLY)  ? US PELVIS TRANSVAGINAL NON-OB (TV ONLY)  ? ? No orders of  the defined types were placed in this encounter. ?  ?  F/U ? Return for We will contact her with any abnormal test results. ?I spent 22 minutes involved in the care of this patient preparing to see the patient by obtaining and reviewing her medical history (including labs, imaging tests and prior procedures), documenting clinical information in the electronic health record  (EHR), counseling and coordinating care plans, writing and sending prescriptions, ordering tests or procedures and in direct communicating with the patient and medical staff discussing pertinent items from her history and physical exam. ? ?Elonda Husky, M.D. ?12/19/2021 ?3:44 PM ? ? ? ?

## 2021-12-19 NOTE — Progress Notes (Signed)
Patient presents today for pre-op exam. She reports no pain since beginning of March. Patient states she is curious to know since her pain is gone if her surgery is still needed or not. Patient states no other concerns.  ?

## 2021-12-28 ENCOUNTER — Emergency Department
Admission: EM | Admit: 2021-12-28 | Discharge: 2021-12-28 | Disposition: A | Discharge status: Home / Self Care | Payer: Medicaid Other | Attending: Emergency Medicine | Admitting: Emergency Medicine

## 2021-12-28 ENCOUNTER — Other Ambulatory Visit: Payer: Self-pay

## 2021-12-28 DIAGNOSIS — N390 Urinary tract infection, site not specified: Secondary | ICD-10-CM | POA: Diagnosis not present

## 2021-12-28 DIAGNOSIS — R1032 Left lower quadrant pain: Secondary | ICD-10-CM

## 2021-12-28 LAB — CBC
HCT: 40.2 % (ref 36.0–46.0)
Hemoglobin: 12.6 g/dL (ref 12.0–15.0)
MCH: 28.6 pg (ref 26.0–34.0)
MCHC: 31.3 g/dL (ref 30.0–36.0)
MCV: 91.4 fL (ref 80.0–100.0)
Platelets: 279 10*3/uL (ref 150–400)
RBC: 4.4 MIL/uL (ref 3.87–5.11)
RDW: 13.8 % (ref 11.5–15.5)
WBC: 13.7 10*3/uL — ABNORMAL HIGH (ref 4.0–10.5)
nRBC: 0 % (ref 0.0–0.2)

## 2021-12-28 LAB — COMPREHENSIVE METABOLIC PANEL
ALT: 13 U/L (ref 0–44)
AST: 17 U/L (ref 15–41)
Albumin: 3.5 g/dL (ref 3.5–5.0)
Alkaline Phosphatase: 63 U/L (ref 38–126)
Anion gap: 10 (ref 5–15)
BUN: 8 mg/dL (ref 6–20)
CO2: 23 mmol/L (ref 22–32)
Calcium: 8.6 mg/dL — ABNORMAL LOW (ref 8.9–10.3)
Chloride: 101 mmol/L (ref 98–111)
Creatinine, Ser: 1.08 mg/dL — ABNORMAL HIGH (ref 0.44–1.00)
GFR, Estimated: >60 mL/min (ref >60)
Glucose, Bld: 100 mg/dL — ABNORMAL HIGH (ref 70–99)
Potassium: 3.4 mmol/L — ABNORMAL LOW (ref 3.5–5.1)
Sodium: 134 mmol/L — ABNORMAL LOW (ref 135–145)
Total Bilirubin: 0.6 mg/dL (ref 0.3–1.2)
Total Protein: 7.6 g/dL (ref 6.5–8.1)

## 2021-12-28 LAB — URINALYSIS, ROUTINE W REFLEX MICROSCOPIC
Bilirubin Urine: NEGATIVE
Glucose, UA: NEGATIVE mg/dL
Ketones, ur: NEGATIVE mg/dL
Nitrite: POSITIVE — AB
Protein, ur: 100 mg/dL — AB
Specific Gravity, Urine: 1.021 (ref 1.005–1.030)
WBC, UA: >50 WBC/hpf — ABNORMAL HIGH (ref 0–5)
pH: 5 (ref 5.0–8.0)

## 2021-12-28 LAB — LIPASE, BLOOD: Lipase: 38 U/L (ref 11–51)

## 2021-12-28 LAB — POC URINE PREG, ED: Preg Test, Ur: NEGATIVE

## 2021-12-28 MED ORDER — PHENAZOPYRIDINE HCL 95 MG PO TABS: 95.0000 mg | ORAL_TABLET | Freq: Three times a day (TID) | ORAL | 0 refills | Status: DC | PRN

## 2021-12-28 MED ORDER — CEFDINIR 300 MG PO CAPS: 300.0000 mg | ORAL_CAPSULE | Freq: Two times a day (BID) | ORAL | 0 refills | Status: AC

## 2021-12-28 NOTE — ED Provider Notes (Signed)
? ?[facility] ?Provider Note ? ? Event Date/Time  ? First MD Initiated Contact with Patient 12/28/21 1035   ?  (approximate) ?History  ?Abdominal Pain ? ?HPI ?Tracy Glover is a 31 y.o. female with a past medical history of ovarian cysts, status postcholecystectomy, and recurrent syncope who presents for left lower quadrant abdominal pain in the setting of dysuria.  Patient states that she has been having the symptoms for the last 24 hours, they are 10/10 in severity, and nonradiating.  Patient denies trying any medications to alleviate this pain.  Patient currently denies any vision changes, tinnitus, difficulty speaking, facial droop, sore throat, chest pain, shortness of breath, nausea/vomiting/diarrhea, or weakness/numbness/paresthesias in any extremity ?Physical Exam  ?Triage Vital Signs: ?ED Triage Vitals  ?Enc Vitals Group  ?   BP 12/28/21 1017 125/74  ?   Pulse Rate 12/28/21 1017 90  ?   Resp 12/28/21 1017 18  ?   Temp 12/28/21 1017 99.3 ?F (37.4 ?C)  ?   Temp Source 12/28/21 1017 Oral  ?   SpO2 12/28/21 1017 98 %  ?   Weight --   ?   Height --   ?   Head Circumference --   ?   Peak Flow --   ?   Pain Score 12/28/21 1044 10  ?   Pain Loc --   ?   Pain Edu? --   ?   Excl. in [place]? --   ? ?Most recent vital signs: ?Vitals:  ? 12/28/21 1017  ?BP: 125/74  ?Pulse: 90  ?Resp: 18  ?Temp: 99.3 ?F (37.4 ?C)  ?SpO2: 98%  ? ?General: Awake, oriented x4. ?CV:  Good peripheral perfusion.  ?Resp:  Normal effort.  ?Abd:  No distention.  ?Other:  Middle-aged overweight Caucasian female laying in bed in mild distress secondary to pain ?ED Results / Procedures / Treatments  ?Labs ?(all labs ordered are listed, but only abnormal results are displayed) ?Labs Reviewed  ?COMPREHENSIVE METABOLIC PANEL - Abnormal; Notable for the following components:  ?    Result Value  ? Sodium 134 (*)   ? Potassium 3.4 (*)   ? Glucose, Bld 100 (*)   ? Creatinine, Ser 1.08 (*)   ? Calcium 8.6 (*)   ? All other  components within normal limits  ?CBC - Abnormal; Notable for the following components:  ? WBC 13.7 (*)   ? All other components within normal limits  ?URINALYSIS, ROUTINE W REFLEX MICROSCOPIC - Abnormal; Notable for the following components:  ? Color, Urine AMBER (*)   ? APPearance CLOUDY (*)   ? Hgb urine dipstick MODERATE (*)   ? Protein, ur 100 (*)   ? Nitrite POSITIVE (*)   ? Leukocytes,Ua LARGE (*)   ? WBC, UA >50 (*)   ? Bacteria, UA MANY (*)   ? All other components within normal limits  ?LIPASE, BLOOD  ?POC URINE PREG, ED  ? ?PROCEDURES: ?Critical Care performed: No ?Procedures ?MEDICATIONS ORDERED IN ED: ?Medications - No data to display ?IMPRESSION / MDM / ASSESSMENT AND PLAN / ED COURSE  ?I reviewed the triage vital signs and the nursing notes. ?             ?               ?31 year old Caucasian female presents for left lower quadrant abdominal pain ?Not Pregnant. Unlikely TOA, Ovarian Torsion, PID, gonorrhea/chlamydia. ?Low suspicion for Infected Urolithiasis, AAA, Cholecystitis, Pancreatitis, SBO,  Appendicitis, or other acute abdomen. ?UA positive for nitrites, large amount of leukocytes, and many bacteria concerning for acute urinary tract infection ?Rx: Cefdinir 300 mg BID for 5 days ?Disposition: Discharge home. SRP discussed. Advise follow up with primary care provider within 24-72 hours. ? ?  ?FINAL CLINICAL IMPRESSION(S) / ED DIAGNOSES  ? ?Final diagnoses:  ?Left lower quadrant abdominal pain  ?Lower urinary tract infectious disease  ? ?Rx / DC Orders  ? ?ED Discharge Orders   ? ?      Ordered  ?  cefdinir (OMNICEF) 300 MG capsule  2 times daily       ? 12/28/21 1056  ?  phenazopyridine (PYRIDIUM) 95 MG tablet  3 times daily PRN       ? 12/28/21 1056  ? ?  ?  ? ?  ? ?Note:  This document was prepared using Dragon voice recognition software and may include unintentional dictation errors. ?  ?Merwyn Katos, MD ?12/28/21 1058 ? ?

## 2021-12-28 NOTE — ED Notes (Signed)
See triage note. Pt to ED for LLQ abdominal and flank pain, known R ovarian cyst. ?

## 2021-12-28 NOTE — ED Triage Notes (Signed)
Pt c/o worsening LLQ pain since yesterday states she has a known ovarian cyst. States she has been having regular BM's. Pt is in NAD on arrival ?

## 2021-12-29 ENCOUNTER — Other Ambulatory Visit: Payer: Self-pay

## 2021-12-29 ENCOUNTER — Encounter: Payer: Self-pay | Admitting: Emergency Medicine

## 2021-12-29 DIAGNOSIS — R519 Headache, unspecified: Secondary | ICD-10-CM | POA: Diagnosis not present

## 2021-12-29 DIAGNOSIS — Z5321 Procedure and treatment not carried out due to patient leaving prior to being seen by health care provider: Secondary | ICD-10-CM | POA: Insufficient documentation

## 2021-12-29 DIAGNOSIS — R109 Unspecified abdominal pain: Secondary | ICD-10-CM | POA: Diagnosis not present

## 2021-12-29 NOTE — ED Triage Notes (Signed)
Pt to ED via [facility] with c/o HA x 3 days, per EMS pt seen yesterday and dx with kidney infection. Per EMS pt's mother called due to patient's HA. Pt A&O x4 on arrival to ED. Pt c/o continued flank pain, and HA at this time. Pt states HA started 3 days ago however flank pain was worse than her HA.   ? ? ? ?128/88 ?96% RA ?97HR ?

## 2021-12-30 ENCOUNTER — Emergency Department
Admission: EM | Admit: 2021-12-30 | Discharge: 2021-12-30 | Discharge status: Against Medical Advice | Payer: Medicaid Other | Attending: Emergency Medicine | Admitting: Emergency Medicine

## 2021-12-30 LAB — CBC
HCT: 41 % (ref 36.0–46.0)
Hemoglobin: 12.9 g/dL (ref 12.0–15.0)
MCH: 28.2 pg (ref 26.0–34.0)
MCHC: 31.5 g/dL (ref 30.0–36.0)
MCV: 89.7 fL (ref 80.0–100.0)
Platelets: 298 10*3/uL (ref 150–400)
RBC: 4.57 MIL/uL (ref 3.87–5.11)
RDW: 13.7 % (ref 11.5–15.5)
WBC: 9.9 10*3/uL (ref 4.0–10.5)
nRBC: 0 % (ref 0.0–0.2)

## 2021-12-30 LAB — COMPREHENSIVE METABOLIC PANEL
ALT: 15 U/L (ref 0–44)
AST: 17 U/L (ref 15–41)
Albumin: 3.5 g/dL (ref 3.5–5.0)
Alkaline Phosphatase: 65 U/L (ref 38–126)
Anion gap: 10 (ref 5–15)
BUN: 11 mg/dL (ref 6–20)
CO2: 24 mmol/L (ref 22–32)
Calcium: 8.8 mg/dL — ABNORMAL LOW (ref 8.9–10.3)
Chloride: 101 mmol/L (ref 98–111)
Creatinine, Ser: 0.83 mg/dL (ref 0.44–1.00)
GFR, Estimated: >60 mL/min (ref >60)
Glucose, Bld: 106 mg/dL — ABNORMAL HIGH (ref 70–99)
Potassium: 3.7 mmol/L (ref 3.5–5.1)
Sodium: 135 mmol/L (ref 135–145)
Total Bilirubin: 1.1 mg/dL (ref 0.3–1.2)
Total Protein: 8 g/dL (ref 6.5–8.1)

## 2022-01-02 LAB — URINE CULTURE: Culture: >=100000 — AB

## 2022-01-08 ENCOUNTER — Other Ambulatory Visit: Payer: Medicaid Other

## 2022-01-12 ENCOUNTER — Encounter: Payer: Self-pay | Admitting: Emergency Medicine

## 2022-01-12 ENCOUNTER — Other Ambulatory Visit: Payer: Self-pay

## 2022-01-12 ENCOUNTER — Emergency Department
Admission: EM | Admit: 2022-01-12 | Discharge: 2022-01-12 | Disposition: A | Discharge status: Home / Self Care | Payer: Medicaid Other | Attending: Student in an Organized Health Care Education/Training Program | Admitting: Student in an Organized Health Care Education/Training Program

## 2022-01-12 ENCOUNTER — Emergency Department: Payer: Medicaid Other

## 2022-01-12 DIAGNOSIS — M545 Low back pain, unspecified: Secondary | ICD-10-CM | POA: Insufficient documentation

## 2022-01-12 DIAGNOSIS — N838 Other noninflammatory disorders of ovary, fallopian tube and broad ligament: Secondary | ICD-10-CM | POA: Insufficient documentation

## 2022-01-12 DIAGNOSIS — N9489 Other specified conditions associated with female genital organs and menstrual cycle: Secondary | ICD-10-CM

## 2022-01-12 LAB — URINALYSIS, ROUTINE W REFLEX MICROSCOPIC
Bilirubin Urine: NEGATIVE
Glucose, UA: NEGATIVE mg/dL
Hgb urine dipstick: NEGATIVE
Ketones, ur: NEGATIVE mg/dL
Leukocytes,Ua: NEGATIVE
Nitrite: NEGATIVE
Protein, ur: NEGATIVE mg/dL
Specific Gravity, Urine: 1.026 (ref 1.005–1.030)
pH: 5 (ref 5.0–8.0)

## 2022-01-12 LAB — POC URINE PREG, ED: Preg Test, Ur: NEGATIVE

## 2022-01-12 MED ORDER — LIDOCAINE 5 % EX PTCH
1.0000 | MEDICATED_PATCH | Freq: Once | CUTANEOUS | Status: DC
  Administered 2022-01-12: 1 via TRANSDERMAL
  Filled 2022-01-12: qty 1

## 2022-01-12 MED ORDER — HYDROCODONE-ACETAMINOPHEN 5-325 MG PO TABS
1.0000 | ORAL_TABLET | Freq: Once | ORAL | Status: AC
  Administered 2022-01-12: 1 via ORAL
  Filled 2022-01-12: qty 1

## 2022-01-12 MED ORDER — KETOROLAC TROMETHAMINE 30 MG/ML IJ SOLN
30.0000 mg | Freq: Once | INTRAMUSCULAR | Status: AC
  Administered 2022-01-12: 30 mg via INTRAMUSCULAR
  Filled 2022-01-12: qty 1

## 2022-01-12 MED ORDER — LIDOCAINE 5 % EX PTCH: 1.0000 | MEDICATED_PATCH | Freq: Two times a day (BID) | CUTANEOUS | 0 refills | Status: AC | PRN

## 2022-01-12 MED ORDER — ONDANSETRON 4 MG PO TBDP
4.0000 mg | ORAL_TABLET | Freq: Once | ORAL | Status: AC
Start: 2022-01-12 — End: 2022-01-12
  Administered 2022-01-12: 4 mg via ORAL
  Filled 2022-01-12: qty 1

## 2022-01-12 MED ORDER — CYCLOBENZAPRINE HCL 10 MG PO TABS
10.0000 mg | ORAL_TABLET | Freq: Once | ORAL | Status: AC
  Administered 2022-01-12: 10 mg via ORAL
  Filled 2022-01-12: qty 1

## 2022-01-12 MED ORDER — CYCLOBENZAPRINE HCL 5 MG PO TABS: 5.0000 mg | ORAL_TABLET | Freq: Three times a day (TID) | ORAL | 0 refills | Status: DC | PRN

## 2022-01-12 MED ORDER — NAPROXEN 500 MG PO TABS: 500.0000 mg | ORAL_TABLET | Freq: Two times a day (BID) | ORAL | 0 refills | Status: AC

## 2022-01-12 NOTE — Discharge Instructions (Signed)
Your exam and ultrasound are normal and reassuring at this time but no signs of a UTI or ruptured ovarian cyst.  You should follow-up with your GYN provider for surgical consultation.  Take the prescription meds as directed and use pain patches as prescribed.  Follow with primary provider return to the ED if needed. ?

## 2022-01-12 NOTE — ED Notes (Signed)
Pt verbalized understanding of discharge instructions, prescriptions, and follow-up care instructions. Pt advised if symptoms worsen to return to ED.  

## 2022-01-12 NOTE — ED Notes (Signed)
Pt declined discharge vital signs  

## 2022-01-12 NOTE — ED Provider Notes (Signed)
? ? ?[facility] ?Emergency Department Provider Note ? ? ? ? Event Date/Time  ? First MD Initiated Contact with Patient 01/12/22 1611   ?  (approximate) ? ? ?History  ? ?Back Pain ? ? ?HPI ? ?Tracy Glover is a 31 y.o. female with a history of acute cholecystitis and tubo-ovarian abscess, presents to the ED with low back pain for the last week.  Patient has a history of a persistent right ovarian cyst /mass.  She presents with a 1 week complaint of pain across the lower back.  She denies any urinary symptoms including frequency, urgency, and hematuria.  Patient also denies any abnormal vaginal bleeding or vaginal discharge. She is without reports of fevers, chills, or sweats.  ? ? ?Physical Exam  ? ?Triage Vital Signs: ?ED Triage Vitals [01/12/22 1544]  ?Enc Vitals Group  ?   BP 140/90  ?   Pulse Rate 62  ?   Resp 16  ?   Temp 97.8 ?F (36.6 ?C)  ?   Temp Source Oral  ?   SpO2 95 %  ?   Weight 180 lb (81.6 kg)  ?   Height 5\' 6"  (1.676 m)  ?   Head Circumference   ?   Peak Flow   ?   Pain Score 10  ?   Pain Loc   ?   Pain Edu?   ?   Excl. in [place]?   ? ? ?Most recent vital signs: ?Vitals:  ? 01/12/22 1544  ?BP: 140/90  ?Pulse: 62  ?Resp: 16  ?Temp: 97.8 ?F (36.6 ?C)  ?SpO2: 95%  ? ? ?General Awake, no distress.  ?CV:  Good peripheral perfusion. RRR ?RESP:  Normal effort. CTA ?ABD:  No distention. Soft, nontender mild tender palpation of the CVA region bilaterally. ?MSK:  Normal gait.  Normal spinal alignment without midline tenderness, spasm, deformity, or step-off.  Hip flexion and extension range on exam. ? ? ?ED Results / Procedures / Treatments  ? ?Labs ?(all labs ordered are listed, but only abnormal results are displayed) ?Labs Reviewed  ?URINALYSIS, ROUTINE W REFLEX MICROSCOPIC - Abnormal; Notable for the following components:  ?    Result Value  ? Color, Urine YELLOW (*)   ? APPearance HAZY (*)   ? All other components within normal limits  ?POC URINE PREG, ED   ? ? ? ?EKG ? ? ?RADIOLOGY ? ?I personally viewed and evaluated these images as part of my medical decision making, as well as reviewing the written report by the radiologist. ? ?ED Provider Interpretation: right adnexal mass. Normal color doppler} ? ?US PELVIC COMPLETE W TRANSVAGINAL AND TORSION R/O ? ?Result Date: 01/12/2022 ?CLINICAL DATA:  Right flank and pelvic pain for 1 week. EXAM: TRANSABDOMINAL AND TRANSVAGINAL ULTRASOUND OF PELVIS TECHNIQUE: Both transabdominal and transvaginal ultrasound examinations of the pelvis were performed. Transabdominal technique was performed for global imaging of the pelvis including uterus, ovaries, adnexal regions, and pelvic cul-de-sac. It was necessary to proceed with endovaginal exam following the transabdominal exam to visualize the endometrium and ovaries. COMPARISON:  MRI on 10/30/2021 FINDINGS: Uterus Measurements: 8.2 x 3.5 x 3.8 cm = volume: 58 mL. No fibroids or other mass visualized. Endometrium Thickness: 7 mm.  No focal abnormality visualized. Right ovary Measurements: No normal ovary visualized. A complex cystic and solid mass is seen in the right adnexa, as seen on previous MRI. This is difficult to measure precisely on this exam, but is approximately 6.5 cm  compared to 8.5 cm prior MRI. Internal blood flow is seen within solid portions of this mass. Left ovary Measurements: Not visualized, however no adnexal mass identified. Other findings Small amount of complex free fluid is noted. IMPRESSION: Complex cystic and solid right adnexal mass measuring at least 6.5 cm. This may be mildly decreased in size since previous study, however measurements are difficult to compare across modalities. Differential diagnosis remains cystic ovarian neoplasm, endometrioma, and tubo-ovarian abscess. Surgical evaluation should be considered. Electronically Signed   By: Marlaine Hind M.D.   On: 01/12/2022 19:12   ? ? ?PROCEDURES: ? ?Critical Care performed:  No ? ?Procedures ? ? ?MEDICATIONS ORDERED IN ED: ?Medications  ?lidocaine (LIDODERM) 5 % 1 patch (has no administration in time range)  ?ketorolac (TORADOL) 30 MG/ML injection 30 mg (has no administration in time range)  ?cyclobenzaprine (FLEXERIL) tablet 10 mg (has no administration in time range)  ?HYDROcodone-acetaminophen (NORCO/VICODIN) 5-325 MG per tablet 1 tablet (1 tablet Oral Given 01/12/22 1804)  ?ondansetron (ZOFRAN-ODT) disintegrating tablet 4 mg (4 mg Oral Given 01/12/22 1804)  ? ? ? ?IMPRESSION / MDM / ASSESSMENT AND PLAN / ED COURSE  ?I reviewed the triage vital signs and the nursing notes. ?             ?               ? ?Differential diagnosis includes, but is not limited to, ovarian cyst rupture, persistent adnexal mass, UTI, musculoskeletal strain ? ?Patient to the ED for evaluation of bilateral low back pain.  Patient with a history of right adnexal mass, presents with concern for possible rupture.  Her exam is reassuring and her vital signs are stable without signs of fever or tachycardia.  Patient is evaluated for her complaints with urinalysis that is normal without signs of acute leukocytosis or hematuria.  Ultrasound reveals a stable but decreased in size right adnexal mass.  This is persistent and has been evaluated by GYN.  Patient is reporting improvement of her pain overall.  Patient's diagnosis is consistent with low back pain and stable right adnexal mass  Patient will be discharged home with prescriptions for Lidoderm patches, hydrocodone, naproxen, Flexeril.. Patient is to follow up with Dr. Amalia Hailey and OBG for surgical consultation. Patient is given ED precautions to return to the ED for any worsening or new symptoms. ? ? ?FINAL CLINICAL IMPRESSION(S) / ED DIAGNOSES  ? ?Final diagnoses:  ?Acute bilateral low back pain without sciatica  ?Adnexal mass  ? ? ? ?Rx / DC Orders  ? ?ED Discharge Orders   ? ?      Ordered  ?  cyclobenzaprine (FLEXERIL) 5 MG tablet  3 times daily PRN       ?  01/12/22 1927  ?  lidocaine (LIDODERM) 5 %  Every 12 hours PRN       ? 01/12/22 1927  ?  naproxen (NAPROSYN) 500 MG tablet  2 times daily with meals       ? 01/12/22 1927  ? ?  ?  ? ?  ? ? ? ?Note:  This document was prepared using Dragon voice recognition software and may include unintentional dictation errors. ? ?  ?Asiah Browder, Dannielle Karvonen, PA-C ?01/12/22 2004 ? ?  ?Merlyn Lot, MD ?01/13/22 1118 ? ?

## 2022-01-12 NOTE — ED Triage Notes (Signed)
Pt via POV from home. Pt c/o back pain for the past week. Pt has a hx of R side ovarian cyst but states the past is all across her back. Denies urinary symptoms. Denies vaginal bleeding. Pt is A&OX4 and NAD ?

## 2022-01-14 ENCOUNTER — Encounter: Payer: Self-pay | Admitting: Emergency Medicine

## 2022-01-14 ENCOUNTER — Emergency Department
Admission: EM | Admit: 2022-01-14 | Discharge: 2022-01-14 | Disposition: A | Discharge status: Home / Self Care | Payer: Medicaid Other | Attending: Emergency Medicine | Admitting: Emergency Medicine

## 2022-01-14 ENCOUNTER — Other Ambulatory Visit: Payer: Self-pay

## 2022-01-14 DIAGNOSIS — M5416 Radiculopathy, lumbar region: Secondary | ICD-10-CM | POA: Insufficient documentation

## 2022-01-14 DIAGNOSIS — M62838 Other muscle spasm: Secondary | ICD-10-CM | POA: Diagnosis not present

## 2022-01-14 DIAGNOSIS — Z91040 Latex allergy status: Secondary | ICD-10-CM | POA: Insufficient documentation

## 2022-01-14 DIAGNOSIS — M545 Low back pain, unspecified: Secondary | ICD-10-CM | POA: Diagnosis present

## 2022-01-14 MED ORDER — DEXAMETHASONE SODIUM PHOSPHATE 10 MG/ML IJ SOLN
10.0000 mg | Freq: Once | INTRAMUSCULAR | Status: AC
  Administered 2022-01-14: 10 mg via INTRAMUSCULAR
  Filled 2022-01-14: qty 1

## 2022-01-14 MED ORDER — ORPHENADRINE CITRATE 30 MG/ML IJ SOLN
60.0000 mg | Freq: Two times a day (BID) | INTRAMUSCULAR | Status: DC
  Administered 2022-01-14: 60 mg via INTRAMUSCULAR
  Filled 2022-01-14: qty 2

## 2022-01-14 MED ORDER — PREDNISONE 10 MG PO TABS: 10.0000 mg | ORAL_TABLET | Freq: Every day | ORAL | 0 refills | Status: DC

## 2022-01-14 NOTE — ED Notes (Signed)
See triage note  presents with lower back pain  states she is having lower back pain which is moving into right leg  sxs' started about 1 week ago   denies any injury  ambulates well   states she is also  having pain when she bends her neck ?

## 2022-01-14 NOTE — Discharge Instructions (Signed)
Please take Flexeril as prescribed.  Take 6-day steroid taper as prescribed.  Once you finish 6-day steroid taper, you can restart naproxen.  Call orthopedic office to schedule follow-up appointment in 1 week if no improvement ?

## 2022-01-14 NOTE — ED Triage Notes (Signed)
Pt via POV from home. Pt c/o lower back pain states that it radiates to her R leg, states she was here 2 days ago and had an US done because she had an ovarian cyst, but she needs an [place]. States the pain medication prescribed is not helping. States she cannot bend her neck. Denies any injury. Pt is A&OX4 and NAD ?

## 2022-01-14 NOTE — ED Provider Notes (Signed)
?[facility] EMERGENCY DEPARTMENT ?Provider Note ? ? ?CSN: [id number] ?Arrival date & time: 01/14/22  1642 ? ?  ? ?History ? ?Chief Complaint  ?Patient presents with  ? Back Pain  ? ? ?Tracy SINER is a 31 y.o. female with no significant past medical history presents to the emergency department for evaluation of neck and right lower back pain.  Patient states for several days she has had muscle spasms and tightness in her neck with stiffness along with right paravertebral muscles of the cervical spine.  No radicular symptoms in the upper extremities.Patient experienced back pain with numbness tingling and radicular symptoms on the right lower extremity starting in her buttocks, lateral hip, anterior lateral shin and into her right foot.  Her symptoms are constant and increased with standing and weightbearing activity.  She denies any weakness or loss of bowel or bladder symptoms.  She was given Toradol 60 mg IM in her PCPs office earlier today and initially did not have much relief but now she is able to walk and has improved pain in her lower back as well as her neck.  Her neck was very stiff initially but now she is able to fully move the neck with no radicular symptoms.  She has no reports of fevers, rashes.  Patient denies any recent falls trauma injury.  Patient denies any vision changes. ? ?HPI ? ?  ? ?Home Medications ?Prior to Admission medications   ?Medication Sig Start Date End Date Taking? Authorizing Provider  ?predniSONE (DELTASONE) 10 MG tablet Take 1 tablet (10 mg total) by mouth daily. 6,5,4,3,2,1 six day taper 01/14/22  Yes Duanne Guess, PA-C  ?cyclobenzaprine (FLEXERIL) 5 MG tablet Take 1 tablet (5 mg total) by mouth 3 (three) times daily as needed. 01/12/22   Menshew, Dannielle Karvonen, PA-C  ?lidocaine (LIDODERM) 5 % Place 1 patch onto the skin every 12 (twelve) hours as needed for up to 10 days. Remove & Discard patch after 12 hours of wear each day. 01/12/22 01/22/22   Menshew, Dannielle Karvonen, PA-C  ?naproxen (NAPROSYN) 500 MG tablet Take 1 tablet (500 mg total) by mouth 2 (two) times daily with a meal for 15 days. 01/12/22 01/27/22  Menshew, Dannielle Karvonen, PA-C  ?phenazopyridine (PYRIDIUM) 95 MG tablet Take 1 tablet (95 mg total) by mouth 3 (three) times daily as needed for pain. 12/28/21   Naaman Plummer, MD  ?   ? ?Allergies    ?Morphine and related and Latex   ? ?Review of Systems   ?Review of Systems ? ?Physical Exam ?Updated Vital Signs ?BP (!) 129/96   Pulse 85   Temp 98.1 ?F (36.7 ?C) (Oral)   Resp 20   Ht 5\' 6"  (1.676 m)   Wt 90.7 kg   SpO2 100%   BMI 32.28 kg/m?  ?Physical Exam ?Constitutional:   ?   Appearance: She is well-developed.  ?HENT:  ?   Head: Normocephalic and atraumatic.  ?   Right Ear: External ear normal.  ?   Left Ear: External ear normal.  ?   Nose: Nose normal.  ?Eyes:  ?   Conjunctiva/sclera: Conjunctivae normal.  ?   Pupils: Pupils are equal, round, and reactive to light.  ?Cardiovascular:  ?   Rate and Rhythm: Normal rate.  ?Pulmonary:  ?   Effort: Pulmonary effort is normal. No respiratory distress.  ?   Breath sounds: Normal breath sounds.  ?Abdominal:  ?   Palpations: Abdomen  is soft.  ?   Tenderness: There is no abdominal tenderness.  ?Musculoskeletal:     ?   General: No swelling, tenderness or deformity. Normal range of motion.  ?   Cervical back: Normal range of motion.  ?   Comments: Patient ambulates nonantalgic gait.  Normal neck range of motion with negative Spurling's test.  She has no weakness in the upper extremities.  Negative head jolt test ? ?Examination of the lumbar spine reveals no bony abnormality, no edema, and no ecchymosis.  There is no step off.  The patient has full range of motion of the lumbar spine with flexion and extension.  The patient has normal lateral bend and rotation.  The patient has no pain with range of motion activities.  The patient has a negative axial load test, and a negative rotational Waddell test.   The patient is non tender along the spinous process.  The patient is non tender along the paravertebral muscles, with no muscle spasms.  The patient is non tender along the iliac crest.  The patient is non tender in the sciatic notch.  The patient is non tender along the Sacroiliac joint.  There is no Coccyx joint tenderness.   ? ?Bilateral Lower Extremities: ?Examination of the lower extremities reveals no bony abnormality, no edema, and no ecchymosis.  The patient has full active and passive range of motion of the hips, knees, and ankles.  There is no discomfort with range of motion exercises.  The patient is non tender along the greater trochanter region.  The patient has a negative Bevelyn Buckles' test bilaterally.  There is normal skin warmth.  There is normal capillary refill bilaterally.   ? ?Neurologic: ?The patient has a negative straight leg raise.  The patient has normal muscle strength testing for the quadriceps, calves, ankle dorsiflexion, ankle plantarflexion, and extensor hallicus longus.  The patient has sensation that is intact to light touch.  No clonus bilaterally, normal deep tendon reflexes bilaterally  ?Skin: ?   General: Skin is warm and dry.  ?   Findings: No rash.  ?Neurological:  ?   General: No focal deficit present.  ?   Mental Status: She is alert and oriented to person, place, and time. Mental status is at baseline.  ?   Cranial Nerves: No cranial nerve deficit.  ?   Motor: No weakness.  ?   Coordination: Coordination normal.  ?   Gait: Gait normal.  ?Psychiatric:     ?   Mood and Affect: Mood normal.     ?   Behavior: Behavior normal.     ?   Thought Content: Thought content normal.  ? ? ?ED Results / Procedures / Treatments   ?Labs ?(all labs ordered are listed, but only abnormal results are displayed) ?Labs Reviewed - No data to display ? ?EKG ?None ? ?Radiology ?No results found. ? ?Procedures ?Procedures  ? ? ?Medications Ordered in ED ?Medications  ?orphenadrine (NORFLEX) injection 60 mg  (60 mg Intramuscular Given 01/14/22 2012)  ?dexamethasone (DECADRON) injection 10 mg (10 mg Intramuscular Given 01/14/22 2012)  ? ? ?ED Course/ Medical Decision Making/ A&P ?  ?                        ?Medical Decision Making ?Risk ?Prescription drug management. ? ? ?31 year old female with cervical muscle spasms and right lumbar radiculopathy.  She has no weakness or neurological deficits in the upper or lower extremities.  Vital signs are stable, afebrile.  Symptoms significantly improved with dexamethasone and Norflex IM.  Patient ambulatory with no antalgic gait.  Patient stable and ready for discharge to home.  She is placed on a 6-day steroid taper and will continue with prescribed Flexeril.  She understands signs and symptoms return to the ER for. ? ?Final Clinical Impression(s) / ED Diagnoses ?Final diagnoses:  ?Cervical paraspinal muscle spasm  ?Right lumbar radiculopathy  ? ? ?Rx / DC Orders ?ED Discharge Orders   ? ?      Ordered  ?  predniSONE (DELTASONE) 10 MG tablet  Daily       ? 01/14/22 2040  ? ?  ?  ? ?  ? ? ?  ?Duanne Guess, PA-C ?01/14/22 2102 ? ?  ?Rada Hay, MD ?01/14/22 2238 ? ?

## 2022-01-14 NOTE — ED Provider Triage Note (Signed)
Emergency Medicine Provider Triage Evaluation Note ? ?Tracy Glover , a 31 y.o. female  was evaluated in triage.  Pt complains of increased back pain, right leg radicular pain, and neck stiffness.  She comes via personal vehicle for reevaluation.  Patient with a stable right ovarian mass evaluated here 2 days ago.  She reports no benefit from the prescribed medications.  She reports to the ED at the advice of her primary provider. ? ?Review of Systems  ?Positive: Neck pain, LBP, right leg pain ?Negative: Incontinence, fevers ? ?Physical Exam  ?BP (!) 129/96   Pulse 85   Temp 98.1 ?F (36.7 ?C) (Oral)   Resp 20   Ht 5\' 6"  (1.676 m)   Wt 90.7 kg   SpO2 100%   BMI 32.28 kg/m?  ?Gen:   Awake, no distress   ?Resp:  Normal effort  ?MSK:   Moves extremities without difficulty  ?Other:   ? ?Medical Decision Making  ?Medically screening exam initiated at 5:37 PM.  Appropriate orders placed.  SAIDE LANUZA was informed that the remainder of the evaluation will be completed by another provider, this initial triage assessment does not replace that evaluation, and the importance of remaining in the ED until their evaluation is complete. ? ?Patient to the ED for evaluation of ongoing low back pain and right leg pain.  She is presenting at the advice of her primary provider for concerns for sciatica. ?  ?Tracy Hint, PA-C ?01/14/22 1742 ? ?

## 2022-07-30 ENCOUNTER — Encounter: Payer: Self-pay | Admitting: Nurse Practitioner

## 2022-07-30 ENCOUNTER — Ambulatory Visit: Payer: Self-pay | Admitting: Nurse Practitioner

## 2022-07-30 DIAGNOSIS — A599 Trichomoniasis, unspecified: Secondary | ICD-10-CM

## 2022-07-30 DIAGNOSIS — Z113 Encounter for screening for infections with a predominantly sexual mode of transmission: Secondary | ICD-10-CM

## 2022-07-30 LAB — HM HIV SCREENING LAB: HM HIV Screening: NEGATIVE

## 2022-07-30 LAB — WET PREP FOR TRICH, YEAST, CLUE
Trichomonas Exam: POSITIVE — AB
Yeast Exam: NEGATIVE

## 2022-07-30 LAB — HEPATITIS B SURFACE ANTIGEN: Hepatitis B Surface Ag: NONREACTIVE

## 2022-07-30 LAB — HM HEPATITIS C SCREENING LAB: HM Hepatitis Screen: NEGATIVE

## 2022-07-30 MED ORDER — METRONIDAZOLE 500 MG PO TABS: 500.0000 mg | ORAL_TABLET | Freq: Two times a day (BID) | ORAL | 0 refills | Status: AC

## 2022-07-30 NOTE — Progress Notes (Signed)
[facility] Department  STI clinic/screening visit [address] [place] [address] [phone]  Subjective:  Tracy Glover is a 31 y.o. female being seen today for an STI screening visit. The patient reports they do have symptoms.  Patient reports that they do not desire a pregnancy in the next year.   They reported they are not interested in discussing contraception today.  Patient had a tubal ligation.   Patient's last menstrual period was 07/19/2022.   Patient has the following medical conditions:   Patient Active Problem List   Diagnosis Date Noted   TOA (tubo-ovarian abscess) 10/28/2021   Postpartum care following cesarean delivery 03/24/2016   Cardiac syncope 09/28/2015   Acute cholecystitis 05/18/2015    Chief Complaint  Patient presents with   SEXUALLY TRANSMITTED DISEASE    Screening- patient complaining of vaginal itching, odor and discharge      HPI  Patient reports to clinic today for STD screening.  Patient reports genital itching, lower abdominal pain, discharge, dysuria, and vaginal irritation for 2 weeks.   Does the patient using douching products? No  Last HIV test per patient/review of record was:  Lab Results  Component Value Date   HIV NON REACTIVE 05/06/2016   Patient reports last pap was:  Lab Results  Component Value Date   DIAGPAP  02/21/2020    - Negative for intraepithelial lesion or malignancy (NILM)     Screening for MPX risk: Does the patient have an unexplained rash? No Is the patient MSM? No Does the patient endorse multiple sex partners or anonymous sex partners? No Did the patient have close or sexual contact with a person diagnosed with MPX? No Has the patient traveled outside the [place] where MPX is endemic? No Is there a high clinical suspicion for MPX-- evidenced by one of the following No  -Unlikely to be chickenpox  -Lymphadenopathy  -Rash that present in same phase of evolution on any given  body part See flowsheet for further details and programmatic requirements.   Immunization history:  Immunization History  Administered Date(s) Administered   PPD Test 09/19/2020     The following portions of the patient's history were reviewed and updated as appropriate: allergies, current medications, past medical history, past social history, past surgical history and problem list.  Objective:  There were no vitals filed for this visit.  Physical Exam Constitutional:      Appearance: Normal appearance.  HENT:     Head: Normocephalic. No abrasion, masses or laceration. Hair is normal.     Right Ear: External ear normal.     Left Ear: External ear normal.     Nose: Nose normal.     Mouth/Throat:     Lips: Pink.     Mouth: Mucous membranes are moist. No oral lesions.     Pharynx: No oropharyngeal exudate or posterior oropharyngeal erythema.     Tonsils: No tonsillar exudate or tonsillar abscesses.  Eyes:     General: Lids are normal.        Right eye: No discharge.        Left eye: No discharge.     Conjunctiva/sclera: Conjunctivae normal.     Right eye: No exudate.    Left eye: No exudate. Abdominal:     General: Abdomen is flat.     Palpations: Abdomen is soft.     Tenderness: There is no abdominal tenderness. There is no rebound.  Genitourinary:    Pubic Area: No  rash or pubic lice.      Labia:        Right: No rash, tenderness, lesion or injury.        Left: No rash, tenderness, lesion or injury.      Vagina: Normal. No vaginal discharge, erythema or lesions.     Cervix: No cervical motion tenderness, discharge, lesion or erythema.     Uterus: Not enlarged and not tender.      Rectum: Normal.     Comments: Amount Discharge: small  Odor: Yes pH: greater than 4.5 Adheres to vaginal wall: No Color: yellowish Musculoskeletal:     Cervical back: Full passive range of motion without pain, normal range of motion and neck supple.  Lymphadenopathy:     Cervical: No  cervical adenopathy.     Right cervical: No superficial, deep or posterior cervical adenopathy.    Left cervical: No superficial, deep or posterior cervical adenopathy.     Upper Body:     Right upper body: No supraclavicular, axillary or epitrochlear adenopathy.     Left upper body: No supraclavicular, axillary or epitrochlear adenopathy.     Lower Body: No right inguinal adenopathy. No left inguinal adenopathy.  Skin:    General: Skin is warm and dry.     Findings: No lesion or rash.  Neurological:     Mental Status: She is alert and oriented to person, place, and time.  Psychiatric:        Attention and Perception: Attention normal.        Mood and Affect: Mood normal.        Speech: Speech normal.        Behavior: Behavior normal. Behavior is cooperative.     Assessment and Plan:  Tracy Glover is a 31 y.o. female presenting to the [facility] Department for STI screening  1. Screening examination for venereal disease -31 year old female in clinic today for STD screening. -Patient accepted all screenings including vaginal CT/GC, wet prep and bloodwork for HIV/RPR.  Patient meets criteria for HepB screening? Yes. Ordered? Yes Patient meets criteria for HepC screening? Yes. Ordered? Yes  Treat wet prep per standing order Discussed time line for State Lab results and that patient will be called with positive results and encouraged patient to call if she had not heard in 2 weeks.  Counseled to return or seek care for continued or worsening symptoms Recommended condom use with all sex  Patient is currently not using  contraception  to prevent pregnancy.  Patient had a tubal ligation.    - HIV/HCV [facility] - Syphilis Serology, [place] [facility] - HBV Antigen/Antibody State Lab - Chlamydia/Gonorrhea  Lab - WET PREP FOR TRICH, YEAST, CLUE    2. Trichimoniasis -Treat patient today for Trich.  Advised to have no sex for 7 days and 7 days after partner is  treated.   - metroNIDAZOLE (FLAGYL) 500 MG tablet; Take 1 tablet (500 mg total) by mouth 2 (two) times daily for 14 days.  Dispense: 14 tablet; Refill: 0   Total time spent: 30 minutes   Return if symptoms worsen or fail to improve.   Glenna Fellows, FNP

## 2022-07-30 NOTE — Progress Notes (Signed)
Pt here for STI screening.  Wet mount results reviewed with patient.  Wet mount positive for Trich.  Metronidazole 500mg  #14 1 tablet po x 7 days dispensed to patient.  Counseled patient on  medication, side effects, plan of care and when to contact clinic for questions and concerns.  Pt voices no concerns at this time.  Contact card given x 1.  Condoms declined.- , RN

## 2022-08-08 ENCOUNTER — Ambulatory Visit: Payer: Self-pay

## 2022-08-16 ENCOUNTER — Telehealth: Payer: Self-pay

## 2022-08-16 NOTE — Telephone Encounter (Signed)
Pt called for lab results. Results reviewed with RN.

## 2022-10-11 ENCOUNTER — Emergency Department
Admission: EM | Admit: 2022-10-11 | Discharge: 2022-10-11 | Disposition: A | Discharge status: Home / Self Care | Payer: Self-pay | Attending: Emergency Medicine | Admitting: Emergency Medicine

## 2022-10-11 ENCOUNTER — Emergency Department: Payer: Self-pay

## 2022-10-11 ENCOUNTER — Other Ambulatory Visit: Payer: Self-pay

## 2022-10-11 DIAGNOSIS — N80129 Deep endometriosis of ovary, unspecified ovary: Secondary | ICD-10-CM

## 2022-10-11 DIAGNOSIS — N80121 Deep endometriosis of right ovary: Secondary | ICD-10-CM | POA: Insufficient documentation

## 2022-10-11 LAB — LIPASE, BLOOD: Lipase: 34 U/L (ref 11–51)

## 2022-10-11 LAB — URINALYSIS, ROUTINE W REFLEX MICROSCOPIC
Bilirubin Urine: NEGATIVE
Glucose, UA: NEGATIVE mg/dL
Hgb urine dipstick: NEGATIVE
Ketones, ur: NEGATIVE mg/dL
Leukocytes,Ua: NEGATIVE
Nitrite: NEGATIVE
Protein, ur: NEGATIVE mg/dL
Specific Gravity, Urine: 1.014 (ref 1.005–1.030)
pH: 5 (ref 5.0–8.0)

## 2022-10-11 LAB — CBC WITH DIFFERENTIAL/PLATELET
Abs Immature Granulocytes: 0.03 10*3/uL (ref 0.00–0.07)
Basophils Absolute: 0.1 10*3/uL (ref 0.0–0.1)
Basophils Relative: 1 %
Eosinophils Absolute: 0.4 10*3/uL (ref 0.0–0.5)
Eosinophils Relative: 4 %
HCT: 40.7 % (ref 36.0–46.0)
Hemoglobin: 12.9 g/dL (ref 12.0–15.0)
Immature Granulocytes: 0 %
Lymphocytes Relative: 32 %
Lymphs Abs: 2.9 10*3/uL (ref 0.7–4.0)
MCH: 29.3 pg (ref 26.0–34.0)
MCHC: 31.7 g/dL (ref 30.0–36.0)
MCV: 92.3 fL (ref 80.0–100.0)
Monocytes Absolute: 0.5 10*3/uL (ref 0.1–1.0)
Monocytes Relative: 6 %
Neutro Abs: 5.3 10*3/uL (ref 1.7–7.7)
Neutrophils Relative %: 57 %
Platelets: 400 10*3/uL (ref 150–400)
RBC: 4.41 MIL/uL (ref 3.87–5.11)
RDW: 15.1 % (ref 11.5–15.5)
WBC: 9.1 10*3/uL (ref 4.0–10.5)
nRBC: 0 % (ref 0.0–0.2)

## 2022-10-11 LAB — COMPREHENSIVE METABOLIC PANEL
ALT: 16 U/L (ref 0–44)
AST: 16 U/L (ref 15–41)
Albumin: 3.9 g/dL (ref 3.5–5.0)
Alkaline Phosphatase: 68 U/L (ref 38–126)
Anion gap: 10 (ref 5–15)
BUN: 10 mg/dL (ref 6–20)
CO2: 23 mmol/L (ref 22–32)
Calcium: 8.3 mg/dL — ABNORMAL LOW (ref 8.9–10.3)
Chloride: 110 mmol/L (ref 98–111)
Creatinine, Ser: 0.94 mg/dL (ref 0.44–1.00)
GFR, Estimated: >60 mL/min (ref >60)
Glucose, Bld: 80 mg/dL (ref 70–99)
Potassium: 3.9 mmol/L (ref 3.5–5.1)
Sodium: 143 mmol/L (ref 135–145)
Total Bilirubin: 0.6 mg/dL (ref 0.3–1.2)
Total Protein: 7.5 g/dL (ref 6.5–8.1)

## 2022-10-11 LAB — CHLAMYDIA/NGC RT PCR (ARMC ONLY)
Chlamydia Tr: NOT DETECTED
N gonorrhoeae: NOT DETECTED

## 2022-10-11 LAB — WET PREP, GENITAL
Clue Cells Wet Prep HPF POC: NONE SEEN
Sperm: NONE SEEN
Trich, Wet Prep: NONE SEEN
WBC, Wet Prep HPF POC: <10 (ref <10)
Yeast Wet Prep HPF POC: NONE SEEN

## 2022-10-11 LAB — POC URINE PREG, ED: Preg Test, Ur: NEGATIVE

## 2022-10-11 MED ORDER — ACETAMINOPHEN 500 MG PO TABS
1000.0000 mg | ORAL_TABLET | Freq: Once | ORAL | Status: AC
  Administered 2022-10-11: 1000 mg via ORAL
  Filled 2022-10-11: qty 2

## 2022-10-11 MED ORDER — ONDANSETRON 4 MG PO TBDP
4.0000 mg | ORAL_TABLET | Freq: Once | ORAL | Status: AC
  Administered 2022-10-11: 4 mg via ORAL
  Filled 2022-10-11: qty 1

## 2022-10-11 MED ORDER — KETOROLAC TROMETHAMINE 30 MG/ML IJ SOLN
30.0000 mg | Freq: Once | INTRAMUSCULAR | Status: AC
  Administered 2022-10-11: 30 mg via INTRAMUSCULAR
  Filled 2022-10-11: qty 1

## 2022-10-11 MED ORDER — IBUPROFEN 600 MG PO TABS: 600.0000 mg | ORAL_TABLET | Freq: Four times a day (QID) | ORAL | 0 refills | Status: AC | PRN

## 2022-10-11 NOTE — ED Provider Notes (Signed)
[facility] Provider Note    Event Date/Time   First MD Initiated Contact with Patient 10/11/22 1346     (approximate)   History   Ovarian Cyst   HPI  Tracy Glover is a 32 y.o. female with history ovarian cyst who comes in with concern for worsening cyst pain.  She has a history of acute cholecystitis and tubo-ovarian abscess who came in with lower abdominal pain.  She reports a history of cyst in the past.  Denies any vaginal discharge or concerns for STDs but does report having a new partner.  Patient reports that her pain is on the right side is been going on for the past few days.  She reports it became worse yesterday night.  Was associate with some vomiting.  I reviewed the records were patient was admitted back in February 2023 with concerns for this right sided mass.  MRI was ordered and patient was admitted to the OB/GYN team and it was thought to not be a tubo-ovarian abscess but a probable endometrioma.  They were scheduled an outpatient exploratory laparotomy to remove it but patient reported that she just never followed up.  She has since been to the ER multiple times with the pain with ultrasounds and has not shown any torsion and similar pain episodes and was told to follow-up with the OB/GYN but never did.  She states that she just got busy with life.  Physical Exam   Triage Vital Signs: ED Triage Vitals  Enc Vitals Group     BP 10/11/22 1317 (!) 132/102     Pulse Rate 10/11/22 1317 94     Resp 10/11/22 1317 18     Temp 10/11/22 1317 97.6 F (36.4 C)     Temp Source 10/11/22 1317 Oral     SpO2 10/11/22 1317 94 %     Weight 10/11/22 1354 199 lb 15.3 oz (90.7 kg)     Height 10/11/22 1354 5\' 6"  (1.676 m)     Head Circumference --      Peak Flow --      Pain Score 10/11/22 1314 7     Pain Loc --      Pain Edu? --      Excl. in [place]? --     Most recent vital signs: Vitals:   10/11/22 1317  BP: (!) 132/102  Pulse: 94  Resp: 18   Temp: 97.6 F (36.4 C)  SpO2: 94%     General: Awake, no distress.  CV:  Good peripheral perfusion.  Resp:  Normal effort.  Abd:  No distention.  Other:  Slight tenderness to right lower quadrant without any rebound or guarding   ED Results / Procedures / Treatments   Labs (all labs ordered are listed, but only abnormal results are displayed) Labs Reviewed  COMPREHENSIVE METABOLIC PANEL - Abnormal; Notable for the following components:      Result Value   Calcium 8.3 (*)    All other components within normal limits  URINALYSIS, ROUTINE W REFLEX MICROSCOPIC - Abnormal; Notable for the following components:   Color, Urine YELLOW (*)    APPearance HAZY (*)    All other components within normal limits  CBC WITH DIFFERENTIAL/PLATELET  LIPASE, BLOOD  POC URINE PREG, ED      RADIOLOGY I have reviewed the ultrasound personally and interpreted and patient has cyst noted in the right kidney   PROCEDURES:  Critical Care performed: No  Procedures  MEDICATIONS ORDERED IN ED: Medications  acetaminophen (TYLENOL) tablet 1,000 mg (1,000 mg Oral Given 10/11/22 1601)  ondansetron (ZOFRAN-ODT) disintegrating tablet 4 mg (4 mg Oral Given 10/11/22 1601)  ketorolac (TORADOL) 30 MG/ML injection 30 mg (30 mg Intramuscular Given 10/11/22 1649)     IMPRESSION / MDM / ASSESSMENT AND PLAN / ED COURSE  I reviewed the triage vital signs and the nursing notes.   Patient's presentation is most consistent with acute presentation with potential threat to life or bodily function.   Differential ectopic, pregnancy, UTI, PID, cyst.  UA without evidence of UTI.  CBC normal normal hemoglobin.  CMP reassuring.  Lipase normal pregnancy test negative.  Evaluation patient feeling better after the Tylenol.  Ultrasound shows cystic lesion but without any evidence of torsion.  Differential is neoplasm versus endometrioma versus tubo-ovarian abscess but on review from a year ago this was already  evaluated by OB/GYN and thought to be endometrioma it is stable and actually decreasing in size.  Recommended pelvic exam, STD screening patient initially declined but was able to be convinced  Discussed with the ultrasound sonographer given the concern that patient had not on the vaginal exam and the report of the ultrasound had mentioned that patient had some possible ovarian tissue with good flow but it was hard for sure to say this was actually the ovarian tissue.  Discussed with the ultrasound reader who stated that from what she could tell there was no signs of torsion and that it look like there is good flow but is again a little bit limited due to not being vaginal and that if there was high enough suspicion to consider the vaginal portion.  Discussed this with patient she initially stated that she would do the ultrasound but then when they came to pick her up she refused the ultrasound.  Explained to her that there is a risk for torsion and this can lead to ovarian loss, death, permanent disability.  Patient expressed understanding but at this time she has declined vaginal exam.  She reports no pain.  She states that she needs to leave and does not want to stay any further.  She needs to pick up children.   She understands that we have not fully ruled out torsion although my overall suspicion is lower.  Denies wanting any future children having a prior tubal ligation but I still explained to her how we need to diagnose this early if this is happening to prevent other complications.  She expressed understanding, has capacity to make this decision but is still adamant that she wants to leave the emergency room.  She understands this is against my advice but she does not want to stay for the transvaginal ultrasound.  However it is reassuring that at this time she has no abdominal pain on my reevaluation.   FINAL CLINICAL IMPRESSION(S) / ED DIAGNOSES   Final diagnoses:  Endometrioma of ovary      Rx / DC Orders   ED Discharge Orders          Ordered    ibuprofen (ADVIL) 600 MG tablet  Every 6 hours PRN        10/11/22 1755             Note:  This document was prepared using Dragon voice recognition software and may include unintentional dictation errors.   Vanessa Bellaire, MD 10/11/22 Carollee Massed

## 2022-10-11 NOTE — ED Triage Notes (Signed)
Pt to ED via POV from home. Pt reports she has a known cyst on her ovaries and the pain has gotten worse.

## 2022-10-11 NOTE — Discharge Instructions (Addendum)
Please call your OB/GYN to schedule an appointment.  I recommended that you get a vaginal portion of an ultrasound to rule out any type of torsion given it was somewhat limited due to being abdominal only ultrasound.  They stated that they thought they saw flow but I wanted to ensure it with the vaginal portion.  However you have opted to decline this.  You understand the risk for death permanent disability losing your ovary, fertility issues but you have continued to decline ultrasound vaginally.  We cannot force you to do this but if you develop return of pain you should return to the ER immediately for repeat evaluation.  Otherwise please call the OB team to make follow-up.  You can take Tylenol 1 g every 8 hours with the ibuprofen to help with pain  1. There is a complex cystic area in the right adnexa measuring 5.1  x 3.3 x 5.1 cm. This is seen on multiple prior examinations and has  only minimally decreased in size. Differential diagnosis includes  cystic ovarian neoplasm, endometrioma, tubo-ovarian abscess.

## 2022-10-11 NOTE — ED Notes (Signed)
Pt left prior to DC. Pt called to come back inside.

## 2023-01-02 ENCOUNTER — Other Ambulatory Visit: Payer: Self-pay

## 2023-01-02 DIAGNOSIS — R8271 Bacteriuria: Secondary | ICD-10-CM | POA: Insufficient documentation

## 2023-01-02 DIAGNOSIS — R1031 Right lower quadrant pain: Secondary | ICD-10-CM | POA: Insufficient documentation

## 2023-01-02 LAB — COMPREHENSIVE METABOLIC PANEL
ALT: 17 U/L (ref 0–44)
AST: 19 U/L (ref 15–41)
Albumin: 4 g/dL (ref 3.5–5.0)
Alkaline Phosphatase: 76 U/L (ref 38–126)
Anion gap: 8 (ref 5–15)
BUN: 14 mg/dL (ref 6–20)
CO2: 26 mmol/L (ref 22–32)
Calcium: 9.2 mg/dL (ref 8.9–10.3)
Chloride: 103 mmol/L (ref 98–111)
Creatinine, Ser: 0.88 mg/dL (ref 0.44–1.00)
GFR, Estimated: >60 mL/min (ref >60)
Glucose, Bld: 85 mg/dL (ref 70–99)
Potassium: 3.7 mmol/L (ref 3.5–5.1)
Sodium: 137 mmol/L (ref 135–145)
Total Bilirubin: 0.5 mg/dL (ref 0.3–1.2)
Total Protein: 7.9 g/dL (ref 6.5–8.1)

## 2023-01-02 LAB — URINALYSIS, ROUTINE W REFLEX MICROSCOPIC
Bilirubin Urine: NEGATIVE
Glucose, UA: NEGATIVE mg/dL
Hgb urine dipstick: NEGATIVE
Ketones, ur: NEGATIVE mg/dL
Nitrite: NEGATIVE
Protein, ur: NEGATIVE mg/dL
Specific Gravity, Urine: 1.025 (ref 1.005–1.030)
pH: 5 (ref 5.0–8.0)

## 2023-01-02 LAB — CBC
HCT: 42.8 % (ref 36.0–46.0)
Hemoglobin: 13.6 g/dL (ref 12.0–15.0)
MCH: 29 pg (ref 26.0–34.0)
MCHC: 31.8 g/dL (ref 30.0–36.0)
MCV: 91.3 fL (ref 80.0–100.0)
Platelets: 395 10*3/uL (ref 150–400)
RBC: 4.69 MIL/uL (ref 3.87–5.11)
RDW: 12.9 % (ref 11.5–15.5)
WBC: 10.2 10*3/uL (ref 4.0–10.5)
nRBC: 0 % (ref 0.0–0.2)

## 2023-01-02 LAB — POC URINE PREG, ED: Preg Test, Ur: NEGATIVE

## 2023-01-02 LAB — LIPASE, BLOOD: Lipase: 31 U/L (ref 11–51)

## 2023-01-02 NOTE — ED Triage Notes (Signed)
Pt to ED via pov c/o lower right abd pain. Pt states it started yesterday, sharp in nature. Endorses nausea and vomiting. Denies CP, SOB, fevers, diarrhea

## 2023-01-03 ENCOUNTER — Other Ambulatory Visit: Payer: Self-pay

## 2023-01-03 ENCOUNTER — Emergency Department: Payer: Self-pay

## 2023-01-03 ENCOUNTER — Emergency Department
Admission: EM | Admit: 2023-01-03 | Discharge: 2023-01-03 | Disposition: A | Discharge status: Home / Self Care | Payer: Self-pay | Attending: Emergency Medicine | Admitting: Emergency Medicine

## 2023-01-03 DIAGNOSIS — R8271 Bacteriuria: Secondary | ICD-10-CM

## 2023-01-03 DIAGNOSIS — R1031 Right lower quadrant pain: Secondary | ICD-10-CM

## 2023-01-03 DIAGNOSIS — N83201 Unspecified ovarian cyst, right side: Secondary | ICD-10-CM | POA: Insufficient documentation

## 2023-01-03 LAB — URINALYSIS, ROUTINE W REFLEX MICROSCOPIC
Bilirubin Urine: NEGATIVE
Glucose, UA: NEGATIVE mg/dL
Hgb urine dipstick: NEGATIVE
Ketones, ur: NEGATIVE mg/dL
Nitrite: NEGATIVE
Protein, ur: NEGATIVE mg/dL
Specific Gravity, Urine: 1.019 (ref 1.005–1.030)
pH: 6 (ref 5.0–8.0)

## 2023-01-03 LAB — COMPREHENSIVE METABOLIC PANEL
ALT: 13 U/L (ref 0–44)
AST: 17 U/L (ref 15–41)
Albumin: 3.9 g/dL (ref 3.5–5.0)
Alkaline Phosphatase: 76 U/L (ref 38–126)
Anion gap: 7 (ref 5–15)
BUN: 15 mg/dL (ref 6–20)
CO2: 25 mmol/L (ref 22–32)
Calcium: 8.9 mg/dL (ref 8.9–10.3)
Chloride: 105 mmol/L (ref 98–111)
Creatinine, Ser: 0.96 mg/dL (ref 0.44–1.00)
GFR, Estimated: >60 mL/min (ref >60)
Glucose, Bld: 89 mg/dL (ref 70–99)
Potassium: 4.3 mmol/L (ref 3.5–5.1)
Sodium: 137 mmol/L (ref 135–145)
Total Bilirubin: 0.4 mg/dL (ref 0.3–1.2)
Total Protein: 7.5 g/dL (ref 6.5–8.1)

## 2023-01-03 LAB — CBC
HCT: 41.4 % (ref 36.0–46.0)
Hemoglobin: 13 g/dL (ref 12.0–15.0)
MCH: 28.8 pg (ref 26.0–34.0)
MCHC: 31.4 g/dL (ref 30.0–36.0)
MCV: 91.6 fL (ref 80.0–100.0)
Platelets: 364 10*3/uL (ref 150–400)
RBC: 4.52 MIL/uL (ref 3.87–5.11)
RDW: 12.6 % (ref 11.5–15.5)
WBC: 10.7 10*3/uL — ABNORMAL HIGH (ref 4.0–10.5)
nRBC: 0 % (ref 0.0–0.2)

## 2023-01-03 LAB — LIPASE, BLOOD: Lipase: 33 U/L (ref 11–51)

## 2023-01-03 LAB — POC URINE PREG, ED: Preg Test, Ur: NEGATIVE

## 2023-01-03 MED ORDER — KETOROLAC TROMETHAMINE 30 MG/ML IJ SOLN
30.0000 mg | Freq: Once | INTRAMUSCULAR | Status: AC
  Administered 2023-01-03: 30 mg via INTRAMUSCULAR
  Filled 2023-01-03: qty 1

## 2023-01-03 MED ORDER — ACETAMINOPHEN 500 MG PO TABS
1000.0000 mg | ORAL_TABLET | Freq: Once | ORAL | Status: AC
  Administered 2023-01-03: 1000 mg via ORAL
  Filled 2023-01-03: qty 2

## 2023-01-03 MED ORDER — IBUPROFEN 600 MG PO TABS
600.0000 mg | ORAL_TABLET | Freq: Once | ORAL | Status: AC
  Administered 2023-01-03: 600 mg via ORAL
  Filled 2023-01-03: qty 1

## 2023-01-03 MED ORDER — CEPHALEXIN 500 MG PO CAPS: 500.0000 mg | ORAL_CAPSULE | Freq: Two times a day (BID) | ORAL | 0 refills | Status: AC

## 2023-01-03 MED ORDER — CEPHALEXIN 500 MG PO CAPS
500.0000 mg | ORAL_CAPSULE | Freq: Once | ORAL | Status: AC
  Administered 2023-01-03: 500 mg via ORAL
  Filled 2023-01-03: qty 1

## 2023-01-03 MED ORDER — ONDANSETRON 4 MG PO TBDP: 4.0000 mg | ORAL_TABLET | Freq: Three times a day (TID) | ORAL | 0 refills | Status: DC | PRN

## 2023-01-03 NOTE — ED Provider Notes (Signed)
[facility] Provider Note    Event Date/Time   First MD Initiated Contact with Patient 01/03/23 1102     (approximate)   History   Abdominal Pain   HPI  Tracy Glover is a 32 y.o. female who presents with complaints of right-sided pelvic discomfort, she reports the pain is intermittent but can be moderate at times.  Denies dysuria, no vaginal bleeding or discharge.  No fevers or chills.  Review of record demonstrates she has a history of a complex ovarian cyst on the right she had an MRI last year which was suspicious for endometrioma     Physical Exam   Triage Vital Signs: ED Triage Vitals  Enc Vitals Group     BP 01/03/23 1100 (!) 150/89     Pulse Rate 01/03/23 1100 61     Resp 01/03/23 1100 18     Temp 01/03/23 1100 97.9 F (36.6 C)     Temp Source 01/03/23 1100 Oral     SpO2 01/03/23 1100 96 %     Weight --      Height --      Head Circumference --      Peak Flow --      Pain Score 01/03/23 1054 6     Pain Loc --      Pain Edu? --      Excl. in GC? --     Most recent vital signs: Vitals:   01/03/23 1100 01/03/23 1415  BP: (!) 150/89 110/75  Pulse: 61 67  Resp: 18 16  Temp: 97.9 F (36.6 C)   SpO2: 96% 97%     General: Awake, no distress.  CV:  Good peripheral perfusion.  Resp:  Normal effort.  Abd:  No distention.  Other:     ED Results / Procedures / Treatments   Labs (all labs ordered are listed, but only abnormal results are displayed) Labs Reviewed  CBC - Abnormal; Notable for the following components:      Result Value   WBC 10.7 (*)    All other components within normal limits  URINALYSIS, ROUTINE W REFLEX MICROSCOPIC - Abnormal; Notable for the following components:   Color, Urine YELLOW (*)    APPearance HAZY (*)    Leukocytes,Ua TRACE (*)    Bacteria, UA RARE (*)    All other components within normal limits  LIPASE, BLOOD  COMPREHENSIVE METABOLIC PANEL  POC URINE PREG, ED      EKG     RADIOLOGY Ultrasound demonstrates complex cyst has enlarged    PROCEDURES:  Critical Care performed:   Procedures   MEDICATIONS ORDERED IN ED: Medications  ketorolac (TORADOL) 30 MG/ML injection 30 mg (30 mg Intramuscular Given 01/03/23 1242)     IMPRESSION / MDM / ASSESSMENT AND PLAN / ED COURSE  I reviewed the triage vital signs and the nursing notes. Patient's presentation is most consistent with acute presentation with potential threat to life or bodily function.  Patient presents with right pelvic pain, given history of complex large cyst, concern for torsion, pain from cyst, cyst rupture, doubt appendicitis, lab work overall reassuring  Patient reports she is unable to stay past 3 PM because she has to pick up her kids  Consulted with OB/GYN, Dr. Valentino Saxon recommends outpatient follow-up for likely surgical planning.        FINAL CLINICAL IMPRESSION(S) / ED DIAGNOSES   Final diagnoses:  Cyst of right ovary     Rx /  DC Orders   ED Discharge Orders          Ordered    ondansetron (ZOFRAN-ODT) 4 MG disintegrating tablet  Every 8 hours PRN        01/03/23 1403             Note:  This document was prepared using Dragon voice recognition software and may include unintentional dictation errors.   Jene Every, MD 01/03/23 [phone]

## 2023-01-03 NOTE — Discharge Instructions (Addendum)
Our recommendation was that you stay for further imaging to check for potentially dangerous problems related to your ovary or appendix.  However you decided to leave before these tests were done.  You understood that undiagnosed problems could lead to worsening pain, worsening disease, or even disability or death.  If you change your mind or if your symptoms worsen at all please come back to the emergency department.  Take acetaminophen 650 mg and ibuprofen 400 mg every 6 hours for pain.  Take with food.  There was some bacteria in your urine, take Keflex antibiotic for the full course as prescribed.  Call [facility] gynecology clinic for a follow-up appointment to discuss management of your cyst.

## 2023-01-03 NOTE — Discharge Instructions (Addendum)
You have a large atypical ovarian cyst on the right, please follow-up with Dr. Logan Bores to determine if surgery is necessary

## 2023-01-03 NOTE — ED Triage Notes (Signed)
Pt comes with c/o belly pain that started few days ago. Pt states some N/V. Pt was seen here last night for same.

## 2023-01-03 NOTE — ED Provider Notes (Signed)
[facility] Provider Note    Event Date/Time   First MD Initiated Contact with Patient 01/03/23 [phone]     (approximate)   History   Abdominal Pain   HPI  Tracy Glover is a 32 y.o. female   Past medical history of known right-sided ovarian cyst who presents to the emergency department with right lower quadrant pain.  It has been intermittent over the last several months and she associates this pain with her known cyst.  However in the last 24 hours pain has become severe and constant.  She denies nausea vomiting diarrhea or fever.  No urinary symptoms.  She has no vaginal discharge.  Independent Historian contributed to assessment above: Her friend who is in the room corroborates information given above  External Medical Documents Reviewed: Transabdominal ultrasound of the pelvis dated February 2024 which notes a right-sided complex cyst in the right adnexa      Physical Exam   Triage Vital Signs: ED Triage Vitals  Enc Vitals Group     BP 01/02/23 2220 (!) 105/55     Pulse Rate 01/02/23 2220 98     Resp 01/02/23 2220 20     Temp 01/02/23 2220 97.8 F (36.6 C)     Temp Source 01/02/23 2220 Oral     SpO2 01/02/23 2220 98 %     Weight --      Height --      Head Circumference --      Peak Flow --      Pain Score 01/02/23 2218 8     Pain Loc --      Pain Edu? --      Excl. in GC? --     Most recent vital signs: Vitals:   01/02/23 2220  BP: (!) 105/55  Pulse: 98  Resp: 20  Temp: 97.8 F (36.6 C)  SpO2: 98%    General: Awake, no distress.  CV:  Good peripheral perfusion.  Resp:  Normal effort.  Abd:  No distention.  Other:  Awake alert nontoxic-appearing with no fever, normal vital signs, and right-sided pelvic/lower quadrant tenderness to palpation   ED Results / Procedures / Treatments   Labs (all labs ordered are listed, but only abnormal results are displayed) Labs Reviewed  URINALYSIS, ROUTINE W REFLEX MICROSCOPIC -  Abnormal; Notable for the following components:      Result Value   Color, Urine YELLOW (*)    APPearance HAZY (*)    Leukocytes,Ua TRACE (*)    Bacteria, UA RARE (*)    All other components within normal limits  LIPASE, BLOOD  COMPREHENSIVE METABOLIC PANEL  CBC  POC URINE PREG, ED     I ordered and reviewed the above labs they are notable for rare bacteria in the urine.     PROCEDURES:  Critical Care performed: No  Procedures   MEDICATIONS ORDERED IN ED: Medications  acetaminophen (TYLENOL) tablet 1,000 mg (has no administration in time range)  ibuprofen (ADVIL) tablet 600 mg (has no administration in time range)  cephALEXin (KEFLEX) capsule 500 mg (has no administration in time range)    IMPRESSION / MDM / ASSESSMENT AND PLAN / ED COURSE  I reviewed the triage vital signs and the nursing notes.                                Patient's presentation is most consistent with acute presentation  with potential threat to life or bodily function.  Differential diagnosis includes, but is not limited to, ovarian torsion, ovarian cyst, appendicitis, TOA, UTI, STD, PID   The patient is on the cardiac monitor to evaluate for evidence of arrhythmia and/or significant heart rate changes.  MDM: Patient with known right adnexal/ovarian cyst now with constant severe pain over the last 24 hours to that area.  Transvaginal ultrasound to rule out torsion.  Considered appendicitis but think less likely given the quality and location of the pain is consistent with her pain associated with the cyst in the past, for many months.  She has no fever no elevated white blood cell count.   --- Unfortunately the patient elects to leave the emergency department prior to her ultrasound being performed.  I emphasized the strong recommendation that she stay in case she have a potentially organ threatening or life-threatening emergency like ovarian torsion or appendicitis that can get worse and  cause disability or death if she were to leave and have this undiagnosed, and she expresses full understanding and has full decision-making capacity but states that she would like to leave and will come back if she has any new or worsening. --       FINAL CLINICAL IMPRESSION(S) / ED DIAGNOSES   Final diagnoses:  Bacteriuria  RLQ abdominal pain     Rx / DC Orders   ED Discharge Orders          Ordered    cephALEXin (KEFLEX) 500 MG capsule  2 times daily        01/03/23 0119             Note:  This document was prepared using Dragon voice recognition software and may include unintentional dictation errors.    Pilar Jarvis, MD 01/03/23 [phone]

## 2023-01-21 ENCOUNTER — Encounter: Payer: Self-pay | Admitting: Obstetrics and Gynecology

## 2023-01-21 ENCOUNTER — Ambulatory Visit (INDEPENDENT_AMBULATORY_CARE_PROVIDER_SITE_OTHER): Payer: Self-pay | Admitting: Obstetrics and Gynecology

## 2023-01-21 ENCOUNTER — Ambulatory Visit: Payer: Self-pay | Admitting: Obstetrics and Gynecology

## 2023-01-21 VITALS — BP 117/78 | HR 61 | Ht 66.0 in | Wt 233.0 lb

## 2023-01-21 DIAGNOSIS — N83299 Other ovarian cyst, unspecified side: Secondary | ICD-10-CM

## 2023-01-21 DIAGNOSIS — N83291 Other ovarian cyst, right side: Secondary | ICD-10-CM

## 2023-01-21 NOTE — Progress Notes (Signed)
Patient presents today to discuss surgery regarding right ovarian cyst. She states a few weeks ago experiencing pain but now it is only occasional right sided pain.

## 2023-01-21 NOTE — Progress Notes (Signed)
HPI:      Tracy Glover is a 32 y.o. [phone] who LMP was Patient's last menstrual period was 01/13/2023 (approximate).  Subjective:   She presents today after being seen in the emergency department for a right ovarian cyst which appears complex.  Likely an endometrioma.  Patient reports that her pain is minimal at this time.  She reports that the pain seems to come and go.  She has known about this cyst for a while and has decided that she would not like surgery on it unless it is absolutely necessary.    Hx: The following portions of the patient's history were reviewed and updated as appropriate:             She  has a past medical history of Cholecystitis, gallstones (2016), and Polysubstance abuse (HCC). She does not have any pertinent problems on file. She  has a past surgical history that includes Cholecystectomy (N/A, 05/19/2015); Cesarean section; and Cesarean section with bilateral tubal ligation (N/A, 05/07/2016). Her family history includes Cancer in her mother; Kidney disease in her father. She  reports that she has been smoking cigarettes. She has been smoking an average of .25 packs per day. She has never used smokeless tobacco. She reports current alcohol use. She reports current drug use. Drugs: Marijuana and Cocaine. She currently has no medications in their medication list. She is allergic to morphine and related and latex.       Review of Systems:  Review of Systems  Constitutional: Denied constitutional symptoms, night sweats, recent illness, fatigue, fever, insomnia and weight loss.  Eyes: Denied eye symptoms, eye pain, photophobia, vision change and visual disturbance.  Ears/Nose/Throat/Neck: Denied ear, nose, throat or neck symptoms, hearing loss, nasal discharge, sinus congestion and sore throat.  Cardiovascular: Denied cardiovascular symptoms, arrhythmia, chest pain/pressure, edema, exercise intolerance, orthopnea and palpitations.  Respiratory: Denied pulmonary  symptoms, asthma, pleuritic pain, productive sputum, cough, dyspnea and wheezing.  Gastrointestinal: Denied, gastro-esophageal reflux, melena, nausea and vomiting.  Genitourinary: See HPI for additional information.  Musculoskeletal: Denied musculoskeletal symptoms, stiffness, swelling, muscle weakness and myalgia.  Dermatologic: Denied dermatology symptoms, rash and scar.  Neurologic: Denied neurology symptoms, dizziness, headache, neck pain and syncope.  Psychiatric: Denied psychiatric symptoms, anxiety and depression.  Endocrine: Denied endocrine symptoms including hot flashes and night sweats.   Meds:   No current outpatient medications on file prior to visit.   No current facility-administered medications on file prior to visit.      Objective:     Vitals:   01/21/23 1136  BP: 117/78  Pulse: 61   Filed Weights   01/21/23 1136  Weight: 233 lb (105.7 kg)              Ultrasound results reviewed directly with the patient          Assessment:    [id number] Patient Active Problem List   Diagnosis Date Noted   TOA (tubo-ovarian abscess) 10/28/2021   Postpartum care following cesarean delivery 03/24/2016   Cardiac syncope 09/28/2015   Acute cholecystitis 05/18/2015     1. Complex ovarian cyst     Most likely endometrioma.   Plan:            1.  Roma score  2.  I have discussed the risks and benefits of surgery.  Risk of rupture which is low and risk of torsion discussed.  Patient would like to live with the cyst as long as is not "medically necessary to  have it removed."  We will contact her with Roma score results. Orders Orders Placed This Encounter  Procedures   Ovarian Malignancy Risk-ROMA    No orders of the defined types were placed in this encounter.     F/U  No follow-ups on file. I spent 31 minutes involved in the care of this patient preparing to see the patient by obtaining and reviewing her medical history (including labs, imaging tests and  prior procedures), documenting clinical information in the electronic health record (EHR), counseling and coordinating care plans, writing and sending prescriptions, ordering tests or procedures and in direct communicating with the patient and medical staff discussing pertinent items from her history and physical exam.  Elonda Husky, M.D. 01/21/2023 12:05 PM

## 2023-01-22 LAB — PREMENOPAUSAL INTERP: LOW

## 2023-01-22 LAB — POSTMENOPAUSAL INTERP: LOW

## 2023-01-22 LAB — OVARIAN MALIGNANCY RISK-ROMA
Cancer Antigen (CA) 125: 3.7 U/mL (ref 0.0–38.1)
HE4: 53.5 pmol/L (ref 0.0–61.2)
Postmenopausal ROMA: 0.48
Premenopausal ROMA: 0.8

## 2023-01-28 ENCOUNTER — Ambulatory Visit: Payer: Self-pay

## 2023-05-06 ENCOUNTER — Ambulatory Visit: Payer: Self-pay | Admitting: Family Medicine

## 2023-05-06 DIAGNOSIS — A599 Trichomoniasis, unspecified: Secondary | ICD-10-CM

## 2023-05-06 DIAGNOSIS — Z113 Encounter for screening for infections with a predominantly sexual mode of transmission: Secondary | ICD-10-CM

## 2023-05-06 LAB — WET PREP FOR TRICH, YEAST, CLUE
Trichomonas Exam: POSITIVE — AB
Yeast Exam: NEGATIVE

## 2023-05-06 LAB — HM HIV SCREENING LAB: HM HIV Screening: NEGATIVE

## 2023-05-06 MED ORDER — METRONIDAZOLE 500 MG PO TABS
500.0000 mg | ORAL_TABLET | Freq: Two times a day (BID) | ORAL | Status: DC
Start: 2023-05-06 — End: 2023-05-13

## 2023-05-06 NOTE — Progress Notes (Signed)
[facility] Department  STI clinic/screening visit [address] [place] [address] [phone]  Subjective:  Tracy Glover is a 32 y.o. female being seen today for an STI screening visit. The patient reports they do have symptoms.  Patient reports that they do not desire a pregnancy in the next year.   They reported they are not interested in discussing contraception today.    Patient's last menstrual period was 04/14/2023.  Patient has the following medical conditions:   Patient Active Problem List   Diagnosis Date Noted   TOA (tubo-ovarian abscess) 10/28/2021   Postpartum care following cesarean delivery 03/24/2016   Cardiac syncope 09/28/2015   Acute cholecystitis 05/18/2015    Chief Complaint  Patient presents with   SEXUALLY TRANSMITTED DISEASE    STI screen-vaginal discharge, itching    HPI  Patient reports to clinic for STI testing. States she has had white thick discharge x 3 weeks  Does the patient using douching products? No  Last HIV test per patient/review of record was  Lab Results  Component Value Date   HMHIVSCREEN Negative - Validated 07/30/2022    Lab Results  Component Value Date   HIV NON REACTIVE 05/06/2016   Patient reports last pap was  Lab Results  Component Value Date   DIAGPAP  02/21/2020    - Negative for intraepithelial lesion or malignancy (NILM)   No results found for: "SPECADGYN"  Screening for MPX risk: Does the patient have an unexplained rash? No Is the patient MSM? No Does the patient endorse multiple sex partners or anonymous sex partners? No Did the patient have close or sexual contact with a person diagnosed with MPX? No Has the patient traveled outside the [place] where MPX is endemic? No Is there a high clinical suspicion for MPX-- evidenced by one of the following No  -Unlikely to be chickenpox  -Lymphadenopathy  -Rash that present in same phase of evolution on any given body part See  flowsheet for further details and programmatic requirements.   Immunization history:  Immunization History  Administered Date(s) Administered   PPD Test 09/19/2020     The following portions of the patient's history were reviewed and updated as appropriate: allergies, current medications, past medical history, past social history, past surgical history and problem list.  Objective:  There were no vitals filed for this visit.  Physical Exam Vitals and nursing note reviewed.  Constitutional:      Appearance: Normal appearance.  HENT:     Head: Normocephalic and atraumatic.     Mouth/Throat:     Mouth: Mucous membranes are moist.     Pharynx: Oropharynx is clear. No oropharyngeal exudate or posterior oropharyngeal erythema.  Pulmonary:     Effort: Pulmonary effort is normal.  Abdominal:     General: Abdomen is flat.     Palpations: There is no mass.     Tenderness: There is no abdominal tenderness. There is no rebound.  Genitourinary:    General: Normal vulva.     Exam position: Lithotomy position.     Pubic Area: No rash or pubic lice.      Labia:        Right: No rash or lesion.        Left: No rash or lesion.      Vagina: Vaginal discharge present. No erythema, bleeding or lesions.     Cervix: No cervical motion tenderness, discharge, friability, lesion or erythema.     Uterus: Normal.  Adnexa: Right adnexa normal and left adnexa normal.     Rectum: Normal.     Comments: pH = 4  Thick white discharge present Lymphadenopathy:     Head:     Right side of head: No preauricular or posterior auricular adenopathy.     Left side of head: No preauricular or posterior auricular adenopathy.     Cervical: No cervical adenopathy.     Upper Body:     Right upper body: No supraclavicular, axillary or epitrochlear adenopathy.     Left upper body: No supraclavicular, axillary or epitrochlear adenopathy.     Lower Body: No right inguinal adenopathy. No left inguinal adenopathy.   Skin:    General: Skin is warm and dry.     Findings: No rash.  Neurological:     Mental Status: She is alert and oriented to person, place, and time.      Assessment and Plan:  Tracy Glover is a 32 y.o. female presenting to the [facility] Department for STI screening  1. Screening for venereal disease  - Chlamydia/Gonorrhea [place] [facility] - HIV [place] [facility] - Syphilis Serology, [place] - WET PREP FOR TRICH, YEAST, CLUE   Patient accepted all screenings including  vaginal CT/GC and bloodwork for HIV/RPR, and wet prep. Patient meets criteria for HepB screening? No. Ordered? not applicable Patient meets criteria for HepC screening? No. Ordered? not applicable  Treat wet prep per standing order Discussed time line for [facility] results and that patient will be called with positive results and encouraged patient to call if she had not heard in 2 weeks.  Counseled to return or seek care for continued or worsening symptoms Recommended repeat testing in 3 months with positive results. Recommended condom use with all sex  Patient is currently using Sterilization by Laparoscopy to prevent pregnancy.    Return if symptoms worsen or fail to improve, for STI screening.  No future appointments. Total time spent 20 minutes  Lenice Llamas, [place]

## 2023-05-06 NOTE — Progress Notes (Signed)
Pt is here for STD visit.  Wet prep results reviewed with pt,treatment required per standing order.  Condoms given.   The patient was dispensed metronidazole today. I provided counseling today regarding the medication. We discussed the medication, the side effects and when to call clinic. Patient given the opportunity to ask questions. Questions answered.  Gaspar Garbe, RN

## 2023-05-06 NOTE — Addendum Note (Signed)
Addended by: Monna Fam R on: 05/06/2023 11:22 AM   Modules accepted: Orders

## 2023-05-13 ENCOUNTER — Telehealth: Payer: Self-pay

## 2023-05-13 ENCOUNTER — Telehealth: Payer: Self-pay | Admitting: Family Medicine

## 2023-05-13 ENCOUNTER — Ambulatory Visit: Payer: Self-pay

## 2023-05-13 DIAGNOSIS — A599 Trichomoniasis, unspecified: Secondary | ICD-10-CM

## 2023-05-13 MED ORDER — METRONIDAZOLE 500 MG PO TABS: 500.0000 mg | ORAL_TABLET | Freq: Two times a day (BID) | ORAL | Status: AC
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
## 2023-05-29 ENCOUNTER — Telehealth: Payer: Self-pay | Admitting: Family Medicine

## 2023-05-29 NOTE — Telephone Encounter (Signed)
Patient Called stating you have completed meds from your last STI visit but still having complications. Blood in Urine , pain, and would like to know what she should do from here.

## 2023-05-29 NOTE — Telephone Encounter (Signed)
Pt states that she has completed her Metronidazole, but she is still having problems.  Pt states that she has not been seen for a possible UTI.  Pt instructed to be seen by her PCP or a walk in clinic.  Berdie Ogren, RN

## 2023-11-07 ENCOUNTER — Ambulatory Visit: Payer: Medicaid Other

## 2024-02-23 ENCOUNTER — Other Ambulatory Visit: Payer: Self-pay | Admitting: Family Medicine

## 2024-02-23 DIAGNOSIS — R102 Pelvic and perineal pain: Secondary | ICD-10-CM

## 2024-03-03 ENCOUNTER — Ambulatory Visit
Admission: RE | Admit: 2024-03-03 | Discharge: 2024-03-03 | Disposition: A | Discharge status: Home / Self Care | Source: Ambulatory Visit | Attending: Family Medicine | Admitting: Family Medicine

## 2024-03-03 ENCOUNTER — Other Ambulatory Visit: Payer: Self-pay | Admitting: Family Medicine

## 2024-03-03 DIAGNOSIS — R102 Pelvic and perineal pain: Secondary | ICD-10-CM

## 2024-03-29 ENCOUNTER — Other Ambulatory Visit

## 2024-04-01 ENCOUNTER — Other Ambulatory Visit

## 2024-04-09 ENCOUNTER — Other Ambulatory Visit

## 2024-04-12 ENCOUNTER — Other Ambulatory Visit

## 2024-05-06 ENCOUNTER — Encounter: Admitting: Obstetrics and Gynecology

## 2024-05-26 ENCOUNTER — Encounter: Admitting: Obstetrics and Gynecology

## 2024-06-15 NOTE — Progress Notes (Deleted)
    GYNECOLOGY PROGRESS NOTE  Subjective:    Patient ID: Tracy Glover, female    DOB: 1991-06-17, 33 y.o.   MRN: [id number]  HPI  Patient is a 32 y.o. [phone] female who presents for pelvic pain. She was evaluated at the ED for right flank pain on 11/14/23. They recommended that she have her PCP order an ultrasound. Results came back and it recommened that she be evaluated by gynecology.   {Common ambulatory SmartLinks:19316}  Review of Systems {ros; complete:30496}   Objective:   unknown if currently breastfeeding. There is no height or weight on file to calculate BMI. General appearance: {general exam:16600} Abdomen: {abdominal exam:16834} Pelvic: {pelvic exam:16852::cervix normal in appearance,external genitalia normal,no adnexal masses or tenderness,no cervical motion tenderness,rectovaginal septum normal,uterus normal size, shape, and consistency,vagina normal without discharge} Extremities: {extremity exam:5109} Neurologic: {neuro exam:17854}   Ultrasound:  CLINICAL DATA:  hx complex ovarian cyst/endometrioma   EXAM: TRANSABDOMINAL ULTRASOUND OF PELVIS   DOPPLER ULTRASOUND OF OVARIES   TECHNIQUE: Transabdominal ultrasound examination of the pelvis was performed including evaluation of the uterus, ovaries, adnexal regions, and pelvic cul-de-sac.   COMPARISON:  January 03, 2023, October 11, 2022, Jan 12, 2022   FINDINGS: Uterus   Measurements: 8.4 x 3.7 x 4.6 cm = volume: 75 mL. No fibroids or other mass visualized.   Endometrium   Thickness: 4 mm.  No focal abnormality visualized.   Right ovary   Measurements: 11.2 x 9 x 9.8 cm = volume: 514 mL. Large, multi septated, predominantly cystic lesion filling the right ovary, with the largest cystic component measuring 9.6 x 8.6 x 7.7 cm. The hypoechoic component in the right ovary on the prior study measuring 4.2 x 3.3 x 3.2 cm is no longer present on today's exam. The septations are somewhat  thickened measuring up to 11 mm thick. The smaller cystic components measure 2.8 x 3 x 3.9 cm and 3.7 x 2 x 4.3 cm.   Left ovary   Not visualized or evaluated.   Other findings:  No abnormal free fluid   IMPRESSION: 1. Redemonstration of a large, multi septated cystic lesion filling the right ovary, the largest cystic component measures 9.6 x 8.6 x 7.7 cm. The more solid-appearing, hypoechoic portion on the prior study is no longer present. However, regions of septation thickening are still present measuring up to 11 mm thick. Gynecologic consultation, if not previously obtained, would be recommended for further management. 2. Normal appearance of the uterus and endometrium. The left ovary was not visualized or evaluated.    Assessment:   No diagnosis found.   Plan:   There are no diagnoses linked to this encounter.

## 2024-06-16 ENCOUNTER — Encounter: Admitting: Certified Nurse Midwife
